# Patient Record
Sex: Female | Born: 1970 | Race: Black or African American | Hispanic: No | Marital: Single | Smoking: Current every day smoker
Health system: Southern US, Community
[De-identification: ages and names within clinical notes are randomized; demographics above are authoritative.]

## PROBLEM LIST (undated history)

## (undated) DIAGNOSIS — E538 Deficiency of other specified B group vitamins: Secondary | ICD-10-CM

## (undated) DIAGNOSIS — F419 Anxiety disorder, unspecified: Secondary | ICD-10-CM

## (undated) DIAGNOSIS — T7840XA Allergy, unspecified, initial encounter: Secondary | ICD-10-CM

## (undated) DIAGNOSIS — D509 Iron deficiency anemia, unspecified: Secondary | ICD-10-CM

## (undated) HISTORY — DX: Allergy, unspecified, initial encounter: T78.40XA

## (undated) HISTORY — DX: Anxiety disorder, unspecified: F41.9

## (undated) HISTORY — PX: ELBOW SURGERY: SHX618

## (undated) MED FILL — Iron Sucrose Inj 20 MG/ML (Fe Equiv): INTRAVENOUS | Qty: 10 | Status: AC

---

## 1993-01-12 HISTORY — PX: TUBAL LIGATION: SHX77

## 2009-09-26 ENCOUNTER — Emergency Department (HOSPITAL_COMMUNITY): Admission: EM | Admit: 2009-09-26 | Discharge: 2009-09-26 | Payer: Self-pay | Admitting: Emergency Medicine

## 2009-10-17 ENCOUNTER — Ambulatory Visit (HOSPITAL_COMMUNITY): Admission: RE | Admit: 2009-10-17 | Discharge: 2009-10-18 | Payer: Self-pay | Admitting: Orthopedic Surgery

## 2009-11-20 ENCOUNTER — Encounter: Payer: Self-pay | Admitting: Orthopedic Surgery

## 2009-12-12 ENCOUNTER — Encounter: Payer: Self-pay | Admitting: Orthopedic Surgery

## 2010-01-12 ENCOUNTER — Encounter: Payer: Self-pay | Admitting: Orthopedic Surgery

## 2010-02-12 ENCOUNTER — Encounter: Payer: Self-pay | Admitting: Orthopedic Surgery

## 2010-03-14 ENCOUNTER — Encounter: Payer: Self-pay | Admitting: Orthopedic Surgery

## 2010-09-01 LAB — CBC
HCT: 36 % (ref 36.0–46.0)
MCV: 87.8 fL (ref 78.0–100.0)
Platelets: 239 10*3/uL (ref 150–400)
RBC: 4.1 MIL/uL (ref 3.87–5.11)
WBC: 7.8 10*3/uL (ref 4.0–10.5)

## 2010-09-01 LAB — BASIC METABOLIC PANEL
CO2: 26 mEq/L (ref 19–32)
GFR calc Af Amer: >60 mL/min (ref >60)
GFR calc non Af Amer: >60 mL/min (ref >60)
Glucose, Bld: 90 mg/dL (ref 70–99)

## 2011-05-16 IMAGING — CR DG ELBOW COMPLETE 3+V*R*
4 series · 4 of 4 positions shown · non-contrast
Comparison: None

CLINICAL DATA: Fell.  Injured right arm.

RIGHT ELBOW - COMPLETE 3+ VIEW

[x elbow joint lat right]
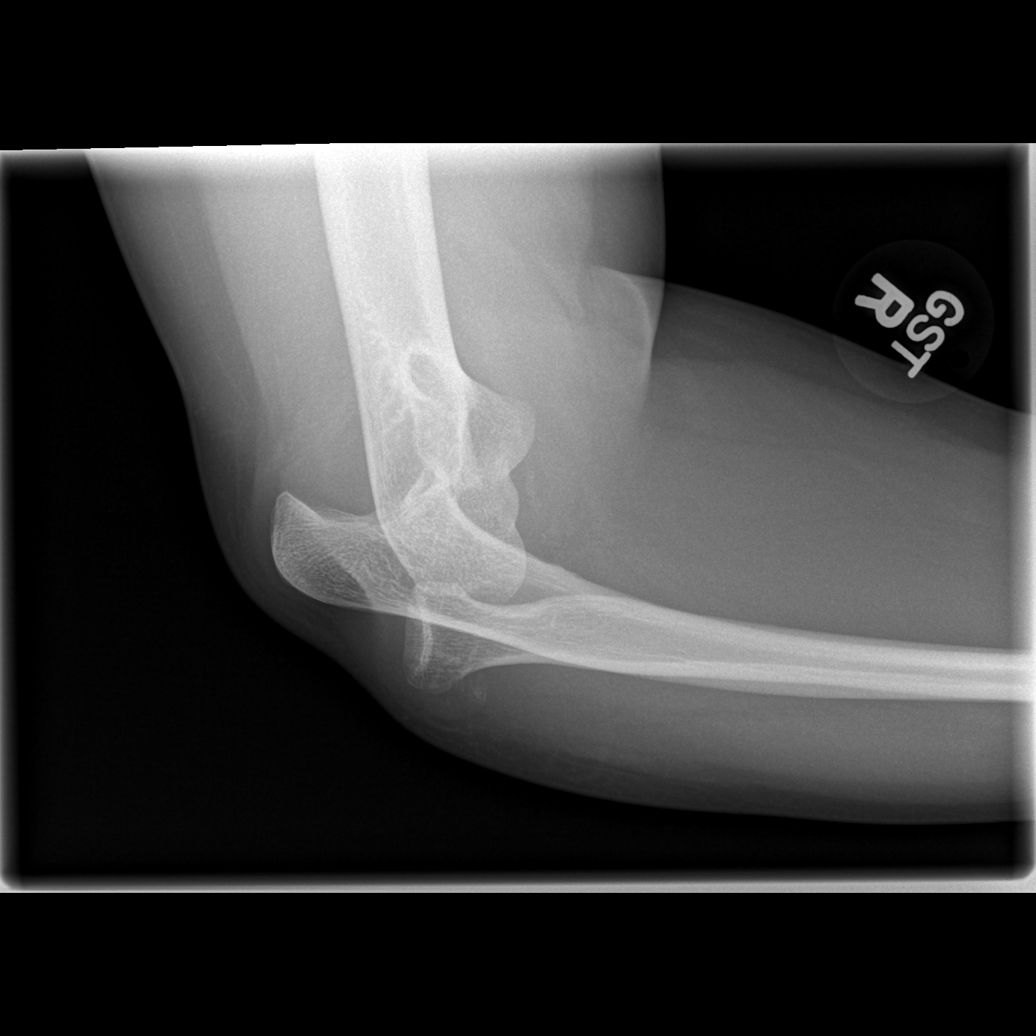

[x elbow joint ap right (1 of 2)]
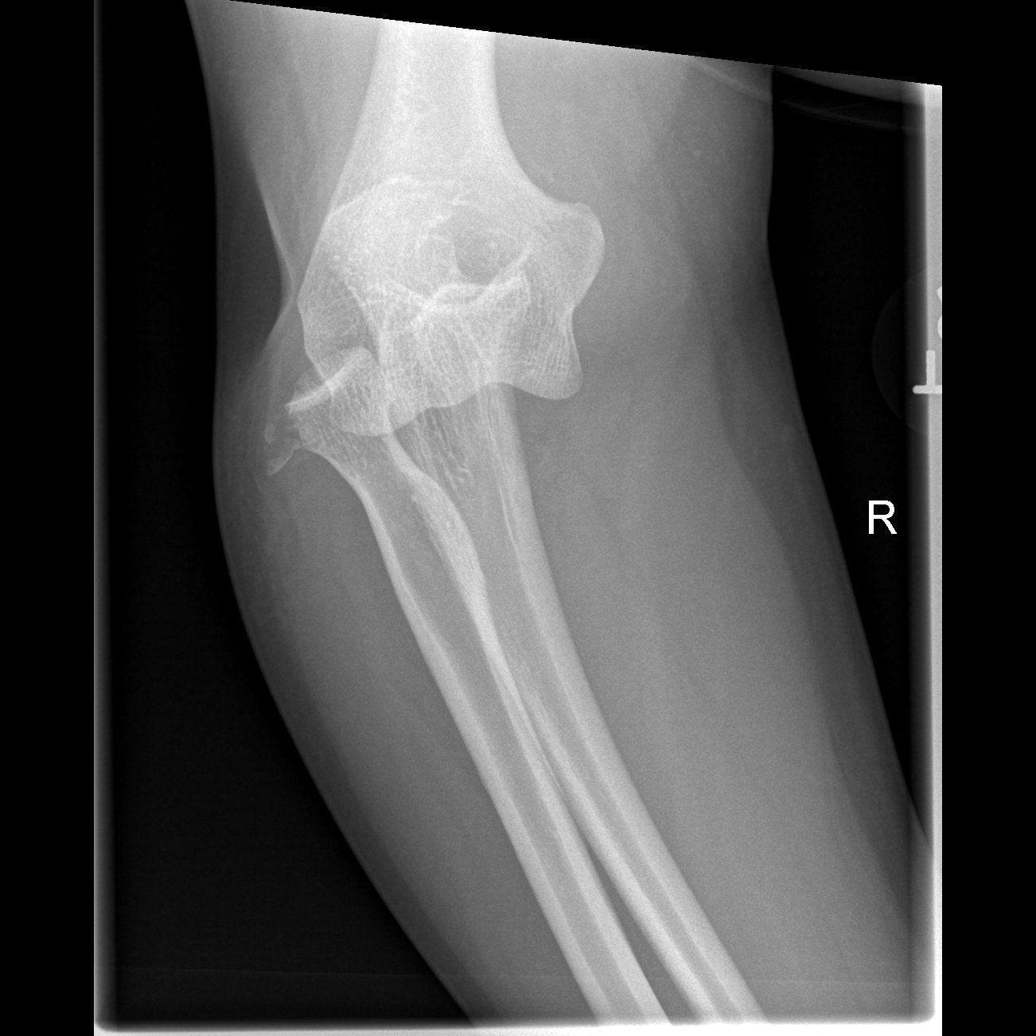

[x elbow joint ap right (2 of 2)]
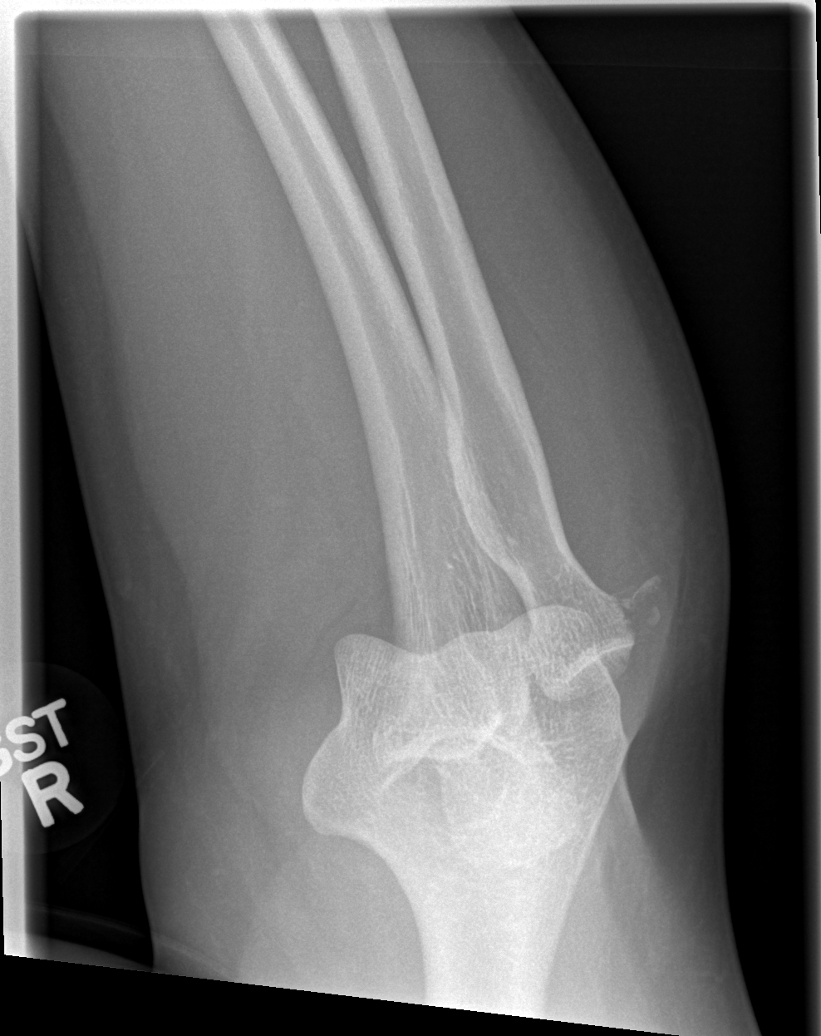

[x elbow joint obl. right]
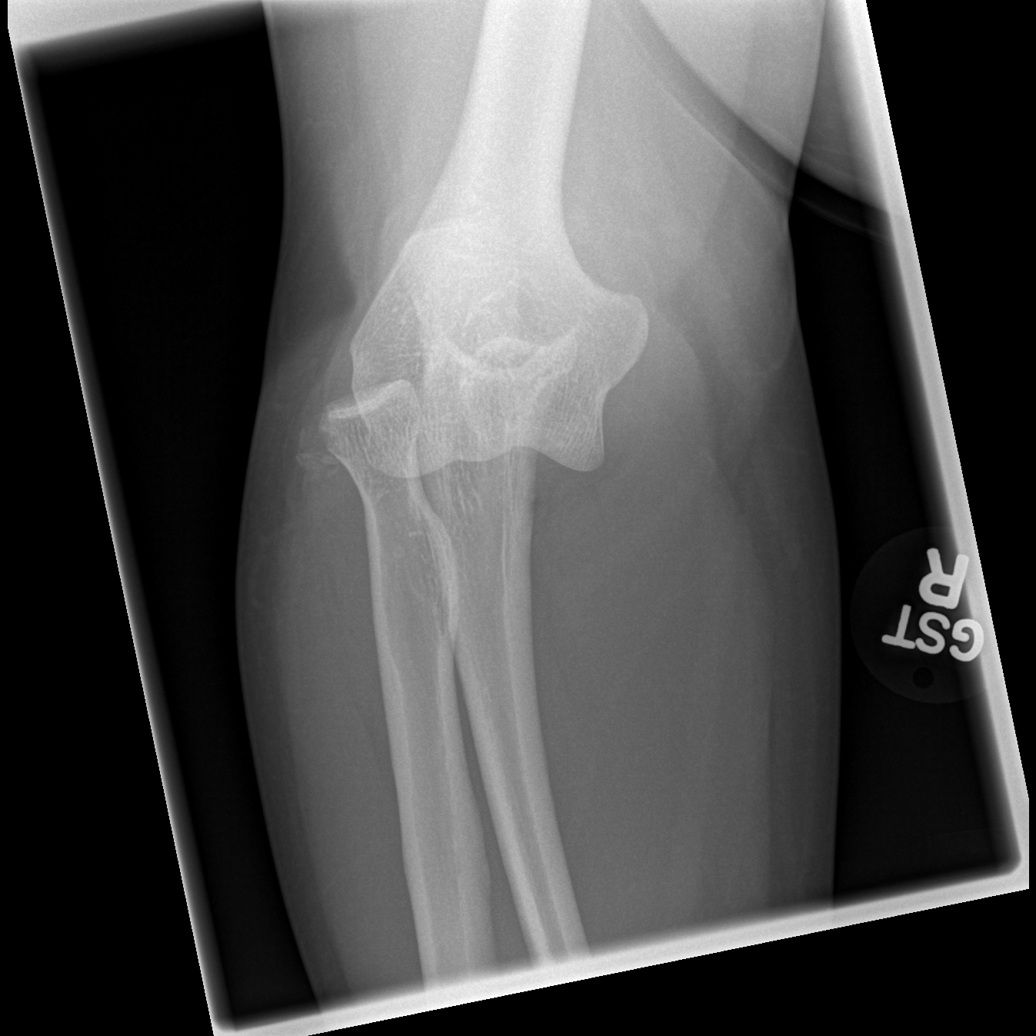

[4 of 4 positions shown; findings below may reference images not displayed]

FINDINGS: There is a posterior dislocation.  There is also a
fracture dislocation involving the radial head.
IMPRESSION: Posterior elbow dislocation with an associated fracture of the
radial head involving less than 20% of the articular surface.

## 2014-04-10 ENCOUNTER — Ambulatory Visit: Payer: Self-pay | Admitting: Obstetrics and Gynecology

## 2014-04-24 ENCOUNTER — Ambulatory Visit: Payer: Self-pay | Admitting: Obstetrics and Gynecology

## 2019-05-11 ENCOUNTER — Ambulatory Visit
Admission: EM | Admit: 2019-05-11 | Discharge: 2019-05-11 | Disposition: A | Discharge status: Home / Self Care | Payer: BLUE CROSS/BLUE SHIELD | Attending: Emergency Medicine | Admitting: Emergency Medicine

## 2019-05-11 DIAGNOSIS — L02412 Cutaneous abscess of left axilla: Secondary | ICD-10-CM | POA: Diagnosis not present

## 2019-05-11 MED ORDER — SULFAMETHOXAZOLE-TRIMETHOPRIM 800-160 MG PO TABS: 1.0000 | ORAL_TABLET | Freq: Two times a day (BID) | ORAL | 0 refills | Status: AC

## 2019-05-11 NOTE — Discharge Instructions (Addendum)
Take the antibiotic as directed.  Keep your wound clean and dry.  Wash it twice a day with soap and water.  Encourage drainage of the pus inside it.    Return here or follow-up with your primary care provider if your abscess is not improving.

## 2019-05-11 NOTE — ED Triage Notes (Signed)
Pt presents with complaints of boil under her left arm that started on Monday. Pt states that it is gradually getting worse. She has tried otc treatment with no relief.

## 2019-05-11 NOTE — ED Provider Notes (Signed)
Hannah Gallagher    CSN: [id number] Arrival date & time: 05/11/19  1156      History   Chief Complaint Chief Complaint  Patient presents with  . Abscess    HPI Hannah Gallagher is a 48 y.o. female.   Patient presents with a large "boil" in her left axilla x 5 days.  She reports the area is red and painful but has no drainage.  She has attempted treatment with warm compresses.  She denies fever, chills, numbness, weakness, tingling, or other symptoms.    The history is provided by the patient.    History reviewed. No pertinent past medical history.  There are no active problems to display for this patient.   Past Surgical History:  Procedure Laterality Date  . ELBOW SURGERY      OB History   No obstetric history on file.      Home Medications    Prior to Admission medications   Medication Sig Start Date End Date Taking? Authorizing Provider  sulfamethoxazole-trimethoprim (BACTRIM DS) 800-160 MG tablet Take 1 tablet by mouth 2 (two) times daily for 10 days. 05/11/19 05/21/19  Mickie Bail, NP    Family History Family History  Problem Relation Age of Onset  . Hypertension Mother   . COPD Father     Social History Social History   Tobacco Use  . Smoking status: Current Every Day Smoker  . Smokeless tobacco: Never Used  Substance Use Topics  . Alcohol use: Yes    Comment: socially  . Drug use: Never     Allergies   Amoxicillin   Review of Systems Review of Systems  Constitutional: Negative for chills and fever.  HENT: Negative for ear pain and sore throat.   Eyes: Negative for pain and visual disturbance.  Respiratory: Negative for cough and shortness of breath.   Cardiovascular: Negative for chest pain and palpitations.  Gastrointestinal: Negative for abdominal pain and vomiting.  Genitourinary: Negative for dysuria and hematuria.  Musculoskeletal: Negative for arthralgias and back pain.  Skin: Positive for wound. Negative for  color change and rash.  Neurological: Negative for seizures and syncope.  All other systems reviewed and are negative.    Physical Exam Triage Vital Signs ED Triage Vitals [05/11/19 1157]  Enc Vitals Group     BP      Pulse      Resp      Temp      Temp src      SpO2      Weight      Height      Head Circumference      Peak Flow      Pain Score 6     Pain Loc      Pain Edu?      Excl. in GC?    No data found.  Updated Vital Signs BP (!) 168/82   Pulse 86   Temp 98.7 F (37.1 C)   Resp 18   LMP 04/23/2019   SpO2 96%   Visual Acuity Right Eye Distance:   Left Eye Distance:   Bilateral Distance:    Right Eye Near:   Left Eye Near:    Bilateral Near:     Physical Exam Vitals signs and nursing note reviewed.  Constitutional:      General: She is not in acute distress.    Appearance: She is well-developed. She is not ill-appearing.  HENT:     Head: Normocephalic and  atraumatic.     Mouth/Throat:     Mouth: Mucous membranes are moist.  Eyes:     Conjunctiva/sclera: Conjunctivae normal.  Neck:     Musculoskeletal: Neck supple.  Cardiovascular:     Rate and Rhythm: Normal rate and regular rhythm.     Heart sounds: No murmur.  Pulmonary:     Effort: Pulmonary effort is normal. No respiratory distress.     Breath sounds: Normal breath sounds.  Abdominal:     Palpations: Abdomen is soft.     Tenderness: There is no abdominal tenderness. There is no guarding or rebound.  Skin:    General: Skin is warm and dry.     Capillary Refill: Capillary refill takes less than 2 seconds.     Comments: 5 cm x 3 cm abscess in left axilla. No drainage. Localized erythema.  Fluctuant in center but firm around edges.    Neurological:     General: No focal deficit present.     Mental Status: She is alert and oriented to person, place, and time.     Sensory: No sensory deficit.     Motor: No weakness.  Psychiatric:        Mood and Affect: Mood normal.        Behavior:  Behavior normal.      UC Treatments / Results  Labs (all labs ordered are listed, but only abnormal results are displayed) Labs Reviewed - No data to display  EKG   Radiology No results found.  Procedures Incision and Drainage  Date/Time: 05/11/2019 12:19 PM Performed by: Mickie Bailate, Clete Kuch H, NP Authorized by: Mickie Bailate, Tailor Westfall H, NP   Consent:    Consent obtained:  Verbal   Consent given by:  Patient Location:    Type:  Abscess   Location:  Upper extremity Pre-procedure details:    Skin preparation:  Antiseptic wash Anesthesia (see MAR for exact dosages):    Anesthesia method:  Local infiltration   Local anesthetic:  Lidocaine 2% w/o epi Procedure details:    Incision types:  Single straight   Scalpel blade:  11   Drainage:  Purulent   Drainage amount:  Moderate   Wound treatment:  Wound left open   Packing materials:  None Post-procedure details:    Patient tolerance of procedure:  Tolerated well, no immediate complications   (including critical care time)  Medications Ordered in UC Medications - No data to display  Initial Impression / Assessment and Plan / UC Course  I have reviewed the triage vital signs and the nursing notes.  Pertinent labs & imaging results that were available during my care of the patient were reviewed by me and considered in my medical decision making (see chart for details).    Abscess of the left axilla.  I&D performed.  Treating with Septra DS.  Wound care instructions and signs of worsening infection discussed with patient.  Instructed her to return here or follow-up with her PCP if her abscess not improving.  Patient agrees to plan of care.     Final Clinical Impressions(s) / UC Diagnoses   Final diagnoses:  Abscess of axilla, left     Discharge Instructions     Take the antibiotic as directed.  Keep your wound clean and dry.  Wash it twice a day with soap and water.  Encourage drainage of the pus inside it.    Return here or  follow-up with your primary care provider if your abscess is not improving.  ED Prescriptions    Medication Sig Dispense Auth. Provider   sulfamethoxazole-trimethoprim (BACTRIM DS) 800-160 MG tablet Take 1 tablet by mouth 2 (two) times daily for 10 days. 20 tablet Sharion Balloon, NP     PDMP not reviewed this encounter.   Sharion Balloon, NP 05/11/19 1225

## 2021-08-12 DIAGNOSIS — Z124 Encounter for screening for malignant neoplasm of cervix: Secondary | ICD-10-CM | POA: Diagnosis not present

## 2021-08-12 DIAGNOSIS — Z01419 Encounter for gynecological examination (general) (routine) without abnormal findings: Secondary | ICD-10-CM | POA: Diagnosis not present

## 2021-08-13 ENCOUNTER — Telehealth: Payer: Self-pay

## 2021-08-13 NOTE — Telephone Encounter (Signed)
Called to schedule and patient stated she is having constipation so scheduled for in person visit 10/08/2021 ?

## 2021-08-14 LAB — HM PAP SMEAR: HM Pap smear: NEGATIVE

## 2021-08-18 DIAGNOSIS — Z1231 Encounter for screening mammogram for malignant neoplasm of breast: Secondary | ICD-10-CM | POA: Diagnosis not present

## 2021-08-18 LAB — HM MAMMOGRAPHY

## 2021-08-31 ENCOUNTER — Ambulatory Visit
Admission: RE | Admit: 2021-08-31 | Discharge: 2021-08-31 | Disposition: A | Discharge status: Home / Self Care | Payer: BC Managed Care – PPO | Attending: Emergency Medicine | Admitting: Emergency Medicine

## 2021-08-31 ENCOUNTER — Other Ambulatory Visit: Payer: Self-pay

## 2021-08-31 ENCOUNTER — Ambulatory Visit
Admission: RE | Admit: 2021-08-31 | Discharge: 2021-08-31 | Disposition: A | Discharge status: Home / Self Care | Payer: BC Managed Care – PPO | Source: Ambulatory Visit | Attending: Emergency Medicine | Admitting: Emergency Medicine

## 2021-08-31 ENCOUNTER — Encounter: Payer: Self-pay | Admitting: Emergency Medicine

## 2021-08-31 ENCOUNTER — Ambulatory Visit
Admission: EM | Admit: 2021-08-31 | Discharge: 2021-08-31 | Disposition: A | Discharge status: Home / Self Care | Payer: BC Managed Care – PPO | Attending: Emergency Medicine | Admitting: Emergency Medicine

## 2021-08-31 DIAGNOSIS — R1084 Generalized abdominal pain: Secondary | ICD-10-CM

## 2021-08-31 DIAGNOSIS — K5900 Constipation, unspecified: Secondary | ICD-10-CM | POA: Diagnosis present

## 2021-08-31 DIAGNOSIS — R030 Elevated blood-pressure reading, without diagnosis of hypertension: Secondary | ICD-10-CM | POA: Diagnosis present

## 2021-08-31 LAB — POCT URINALYSIS DIP (MANUAL ENTRY)
Bilirubin, UA: NEGATIVE
Blood, UA: NEGATIVE
Glucose, UA: NEGATIVE mg/dL
Ketones, POC UA: NEGATIVE mg/dL
Nitrite, UA: NEGATIVE
Protein Ur, POC: NEGATIVE mg/dL
Spec Grav, UA: 1.015 (ref 1.010–1.025)
Urobilinogen, UA: 1 E.U./dL
pH, UA: 6 (ref 5.0–8.0)

## 2021-08-31 LAB — POCT URINE PREGNANCY: Preg Test, Ur: NEGATIVE

## 2021-08-31 NOTE — Discharge Instructions (Addendum)
Go to [facility] for your x-ray.   ?[address]  ?[address] ?[place], [place] [address] ?[phone] ? ?Use a Fleets enema today.  Take MiraLAX and Colace daily as directed.  Follow-up with gastroenterology as scheduled.  ? ?Go to the emergency department if you have acute abdominal pain or other concerning symptoms. ? ?Your blood pressure is elevated today at 177/93; repeat 159/88.  Please have this rechecked by your primary care provider in 1-2 weeks.     ? ?

## 2021-08-31 NOTE — ED Provider Notes (Signed)
?[facility] ? ? ? ?CSN: [id number] ?Arrival date & time: 08/31/21  7939 ? ? ?  ? ?History   ?Chief Complaint ?Chief Complaint  ?Patient presents with  ? Constipation  ? Bloated  ? ? ?HPI ?Hannah Gallagher is a 51 y.o. female.  Patient presents with 1 month history of abdominal bloating, abdominal discomfort, constipation, low back pain.  No fever, nausea, vomiting, diarrhea, blood in stool, dysuria, hematuria, vaginal discharge, pelvic pain, or other symptoms.  Last bowel movement 3 days ago. Treatment attempted with Senakot, stool softener, Dulcolax.   Patient has an appointment with [facility] on 10/08/2021.   ? ?The history is provided by the patient and medical records.  ? ?History reviewed. No pertinent past medical history. ? ?There are no problems to display for this patient. ? ? ?Past Surgical History:  ?Procedure Laterality Date  ? ELBOW SURGERY    ? ? ?OB History   ?No obstetric history on file. ?  ? ? ? ?Home Medications   ? ?Prior to Admission medications   ?Not on File  ? ? ?Family History ?Family History  ?Problem Relation Age of Onset  ? Hypertension Mother   ? COPD Father   ? ? ?Social History ?Social History  ? ?Tobacco Use  ? Smoking status: Every Day  ?  Types: Cigarettes  ? Smokeless tobacco: Never  ?Vaping Use  ? Vaping Use: Never used  ?Substance Use Topics  ? Alcohol use: Yes  ?  Comment: socially  ? Drug use: Never  ? ? ? ?Allergies   ?Amoxicillin ? ? ?Review of Systems ?Review of Systems  ?Constitutional:  Negative for chills and fever.  ?Gastrointestinal:  Positive for abdominal pain and constipation. Negative for blood in stool, diarrhea, nausea and vomiting.  ?Genitourinary:  Negative for dysuria, flank pain, hematuria, pelvic pain and vaginal discharge.  ?Musculoskeletal:  Positive for back pain.  ?All other systems reviewed and are negative. ? ? ?Physical Exam ?Triage Vital Signs ?ED Triage Vitals  ?Enc Vitals Group  ?   BP   ?   Pulse   ?   Resp   ?   Temp    ?   Temp src   ?   SpO2   ?   Weight   ?   Height   ?   Head Circumference   ?   Peak Flow   ?   Pain Score   ?   Pain Loc   ?   Pain Edu?   ?   Excl. in GC?   ? ?No data found. ? ?Updated Vital Signs ?BP (!) 159/88 (BP Location: Right Arm)   Pulse (!) 105   Temp 97.9 ?F (36.6 ?C)   Resp 18   LMP 08/12/2021   SpO2 94%  ? ?Visual Acuity ?Right Eye Distance:   ?Left Eye Distance:   ?Bilateral Distance:   ? ?Right Eye Near:   ?Left Eye Near:    ?Bilateral Near:    ? ?Physical Exam ?Vitals and nursing note reviewed.  ?Constitutional:   ?   General: She is not in acute distress. ?   Appearance: She is well-developed. She is not ill-appearing.  ?HENT:  ?   Mouth/Throat:  ?   Mouth: Mucous membranes are moist.  ?Cardiovascular:  ?   Rate and Rhythm: Normal rate and regular rhythm.  ?   Heart sounds: Normal heart sounds.  ?Pulmonary:  ?   Effort: Pulmonary effort is normal.  No respiratory distress.  ?   Breath sounds: Normal breath sounds.  ?Abdominal:  ?   General: Bowel sounds are normal.  ?   Palpations: Abdomen is soft.  ?   Tenderness: There is no abdominal tenderness. There is no right CVA tenderness, left CVA tenderness, guarding or rebound.  ?Musculoskeletal:  ?   Cervical back: Neck supple.  ?Skin: ?   General: Skin is warm and dry.  ?Neurological:  ?   Mental Status: She is alert.  ?Psychiatric:     ?   Mood and Affect: Mood normal.     ?   Behavior: Behavior normal.  ? ? ? ?UC Treatments / Results  ?Labs ?(all labs ordered are listed, but only abnormal results are displayed) ?Labs Reviewed  ?POCT URINALYSIS DIP (MANUAL ENTRY) - Abnormal; Notable for the following components:  ?    Result Value  ? Clarity, UA cloudy (*)   ? Leukocytes, UA Small (1+) (*)   ? All other components within normal limits  ?URINE CULTURE  ?CBC  ?COMPREHENSIVE METABOLIC PANEL  ?POCT URINE PREGNANCY  ? ? ?EKG ? ? ?Radiology ?DG Abd 1 View ? ?Result Date: 08/31/2021 ?CLINICAL DATA:  Abdominal pain, constipation EXAM: ABDOMEN - 1 VIEW  COMPARISON:  None. FINDINGS: Bowel-gas pattern is nonobstructive. No significantly large amount of retained fecal material appreciated. No suspicious calcifications identified. IMPRESSION: Unremarkable study. Electronically Signed   By: Jannifer Hick M.D.   On: 08/31/2021 10:36   ? ?Procedures ?Procedures (including critical care time) ? ?Medications Ordered in UC ?Medications - No data to display ? ?Initial Impression / Assessment and Plan / UC Course  ?I have reviewed the triage vital signs and the nursing notes. ? ?Pertinent labs & imaging results that were available during my care of the patient were reviewed by me and considered in my medical decision making (see chart for details). ? ? Constipation, Generalized abdominal pain.  Afebrile, VSS.  Abdomen is soft and nontender with good bowel sounds.  Urine has small leukocytes, urine culture pending.  Urine preg negative.  CBC, CMP pending.  KUB negative.  Discussed Fleets enema today and starting Miralax and Colace daily as directed with instructions to decrease use if she develops diarrhea.  ED precautions discussed.   Also discussed with patient that her blood pressure is elevated today and needs to be rechecked by PCP in 1-2 weeks.  Education provided on managing hypertension.   ? ? ?Final Clinical Impressions(s) / UC Diagnoses  ? ?Final diagnoses:  ?Constipation, unspecified constipation type  ?Generalized abdominal pain  ?Elevated blood pressure reading  ? ? ? ?Discharge Instructions   ? ?  ?Go to [facility] for your x-ray.   ?[address]  ?[address] ?[place], [place] [address] ?[phone] ? ?Use a Fleets enema today.  Take MiraLAX and Colace daily as directed.  Follow-up with gastroenterology as scheduled.  ? ?Go to the emergency department if you have acute abdominal pain or other concerning symptoms. ? ?Your blood pressure is elevated today at 177/93; repeat 159/88.  Please have this rechecked by your primary  care provider in 1-2 weeks.     ? ? ? ? ? ?ED Prescriptions   ?None ?  ? ?PDMP not reviewed this encounter. ?  ?Mickie Bail, NP ?08/31/21 1043 ? ?

## 2021-08-31 NOTE — ED Triage Notes (Signed)
Pt c/o constipation, bloating and back pain x 1 month. Pt has tried laxatives and she is able to pass stool, but its not a lot.  ?

## 2021-09-01 ENCOUNTER — Telehealth: Payer: Self-pay | Admitting: Emergency Medicine

## 2021-09-01 LAB — COMPREHENSIVE METABOLIC PANEL
ALT: 14 IU/L (ref 0–32)
AST: 26 IU/L (ref 0–40)
Albumin/Globulin Ratio: 1.3 (ref 1.2–2.2)
Albumin: 4.3 g/dL (ref 3.8–4.8)
Alkaline Phosphatase: 108 IU/L (ref 44–121)
BUN/Creatinine Ratio: 11 (ref 9–23)
BUN: 7 mg/dL (ref 6–24)
Bilirubin Total: 0.2 mg/dL (ref 0.0–1.2)
CO2: 24 mmol/L (ref 20–29)
Calcium: 9.4 mg/dL (ref 8.7–10.2)
Chloride: 101 mmol/L (ref 96–106)
Creatinine, Ser: 0.64 mg/dL (ref 0.57–1.00)
Globulin, Total: 3.2 g/dL (ref 1.5–4.5)
Glucose: 95 mg/dL (ref 70–99)
Potassium: 4.1 mmol/L (ref 3.5–5.2)
Sodium: 142 mmol/L (ref 134–144)
Total Protein: 7.5 g/dL (ref 6.0–8.5)
eGFR: 108 mL/min/{1.73_m2} (ref >59)

## 2021-09-01 LAB — CBC
Hematocrit: 31.5 % — ABNORMAL LOW (ref 34.0–46.6)
Hemoglobin: 10 g/dL — ABNORMAL LOW (ref 11.1–15.9)
MCH: 24.4 pg — ABNORMAL LOW (ref 26.6–33.0)
MCHC: 31.7 g/dL (ref 31.5–35.7)
MCV: 77 fL — ABNORMAL LOW (ref 79–97)
Platelets: 355 10*3/uL (ref 150–450)
RBC: 4.09 x10E6/uL (ref 3.77–5.28)
RDW: 15.8 % — ABNORMAL HIGH (ref 11.7–15.4)
WBC: 8.1 10*3/uL (ref 3.4–10.8)

## 2021-09-01 LAB — URINE CULTURE

## 2021-09-01 NOTE — Telephone Encounter (Signed)
Telephone call to patient.  Discussed CBC and CMP results.  Instructed her to follow up with her PCP for evaluation of her abnormal CBC results.  Also discussed that she should call GI to see if she can be seen sooner; she has an appointment on 10/08/2021.  Reviewed ED precautions if she develops worsening symptoms or new concerning symptoms.  She agrees to this plan of care.   ?

## 2021-09-08 DIAGNOSIS — K5901 Slow transit constipation: Secondary | ICD-10-CM | POA: Diagnosis not present

## 2021-09-08 DIAGNOSIS — N816 Rectocele: Secondary | ICD-10-CM | POA: Diagnosis not present

## 2021-09-11 ENCOUNTER — Ambulatory Visit: Payer: BC Managed Care – PPO | Admitting: Family Medicine

## 2021-10-08 ENCOUNTER — Other Ambulatory Visit: Payer: Self-pay

## 2021-10-08 ENCOUNTER — Ambulatory Visit (INDEPENDENT_AMBULATORY_CARE_PROVIDER_SITE_OTHER): Payer: BC Managed Care – PPO | Admitting: Gastroenterology

## 2021-10-08 ENCOUNTER — Encounter: Payer: Self-pay | Admitting: Gastroenterology

## 2021-10-08 VITALS — BP 148/81 | HR 101 | Temp 97.7°F | Ht 64.0 in | Wt 176.2 lb

## 2021-10-08 DIAGNOSIS — D509 Iron deficiency anemia, unspecified: Secondary | ICD-10-CM | POA: Diagnosis not present

## 2021-10-08 DIAGNOSIS — R194 Change in bowel habit: Secondary | ICD-10-CM

## 2021-10-08 DIAGNOSIS — Z1211 Encounter for screening for malignant neoplasm of colon: Secondary | ICD-10-CM

## 2021-10-08 MED ORDER — NA SULFATE-K SULFATE-MG SULF 17.5-3.13-1.6 GM/177ML PO SOLN: 354.0000 mL | Freq: Once | ORAL | 0 refills | Status: AC

## 2021-10-08 NOTE — Progress Notes (Signed)
?  ?Arlyss Repress, MD ?[address]  ?[address]  ?[place], [place] [address]  ?Main: [phone]  ?Fax: [phone] ? ? ? ?Gastroenterology Consultation ? ?Referring Provider:     Cassell Clement, MD ?Primary Care Physician:  Patient, No Pcp Per (Inactive) ?Primary Gastroenterologist:  Dr. Arlyss Repress ?Reason for Consultation: Microcytic anemia, new onset of constipation ?      ? HPI:   ?Hannah Gallagher is a 51 y.o. female referred by Dr. Patient, No Pcp Per (Inactive)  for consultation & management of new onset of constipation which has started since she had oral surgery in February.  After the surgery, she was not able to eat regular foods, was managing on soft diet only.  She developed severe constipation and was taking liquid Dulcolax.  She developed severe low back pain that prompted her to go to ER on 08/31/2021.  Labs revealed microcytic anemia, hemoglobin 10, MCV 77, CMP normal, UA and urine culture were negative.  Abdominal x-ray did not reveal any significant fecal material.  Patient is therefore referred to GI for further evaluation.  Patient reports that since then she is trying to eat more fiber, cut back on all the sodas.  She does admit to drinking sweet tea and ginger ale along with water.  She does not smoke or drink alcohol.  She works from home and she has lost some weight.  She does reports feeling bloated and feels something down in the rectum area and does not feel right.  She denies any rectal bleeding, however when she has a bowel movement, initially it is formed then runny ? ?NSAIDs: None ? ?Antiplts/Anticoagulants/Anti thrombotics: None ? ?GI Procedures: None ?She denies any family history of GI malignancy ? ?History reviewed. No pertinent past medical history. ? ?Past Surgical History:  ?Procedure Laterality Date  ? ELBOW SURGERY    ? ? ?Current Outpatient Medications:  ?  Na Sulfate-K Sulfate-Mg Sulf 17.5-3.13-1.6 GM/177ML SOLN, Take 354 mLs by mouth once for 1  dose., Disp: 708 mL, Rfl: 0 ? ?Family History  ?Problem Relation Age of Onset  ? Hypertension Mother   ? COPD Father   ?  ? ?Social History  ? ?Tobacco Use  ? Smoking status: Every Day  ?  Types: Cigarettes  ? Smokeless tobacco: Never  ?Vaping Use  ? Vaping Use: Never used  ?Substance Use Topics  ? Alcohol use: Yes  ?  Comment: socially  ? Drug use: Never  ? ? ?Allergies as of 10/08/2021 - Review Complete 10/08/2021  ?Allergen Reaction Noted  ? Amoxicillin  05/11/2019  ? ? ?Review of Systems:    ?All systems reviewed and negative except where noted in HPI. ? ? Physical Exam:  ?BP (!) 148/81 (BP Location: Left Arm, Patient Position: Sitting, Cuff Size: Normal)   Pulse (!) 101   Temp 97.7 ?F (36.5 ?C) (Oral)   Ht 5\' 4"  (1.626 m)   Wt 176 lb 4 oz (79.9 kg)   BMI 30.25 kg/m?  ?No LMP recorded. ? ?General:   Alert,  Well-developed, well-nourished, pleasant and cooperative in NAD ?Head:  Normocephalic and atraumatic. ?Eyes:  Sclera clear, no icterus.   Conjunctiva pink. ?Ears:  Normal auditory acuity. ?Nose:  No deformity, discharge, or lesions. ?Mouth:  No deformity or lesions,oropharynx pink & moist. ?Neck:  Supple; no masses or thyromegaly. ?Lungs:  Respirations even and unlabored.  Clear throughout to auscultation.   No wheezes, crackles, or rhonchi. No acute distress. ?Heart:  Regular  rate and rhythm; no murmurs, clicks, rubs, or gallops. ?Abdomen:  Normal bowel sounds. Soft, non-tender and non-distended without masses, hepatosplenomegaly or hernias noted.  No guarding or rebound tenderness.   ?Rectal: Not performed ?Msk:  Symmetrical without gross deformities. Good, equal movement & strength bilaterally. ?Pulses:  Normal pulses noted. ?Extremities:  No clubbing or edema.  No cyanosis. ?Neurologic:  Alert and oriented x3;  grossly normal neurologically. ?Skin:  Intact without significant lesions or rashes. No jaundice. ?Psych:  Alert and cooperative. Normal mood and affect. ? ?Imaging  Studies: ?Reviewed ? ?Assessment and Plan:  ? ?Rabecca Birge is a 51 y.o. pleasant African-American female with new onset of constipation that started after her oral surgery, microcytic anemia ? ?Check CBC, iron panel, B12 and folate levels ?Check TSH ?Recommend diagnostic colonoscopy with 2-day prep.  If patient has confirmed iron deficiency anemia, discussed about upper endoscopy along with colonoscopy and she is agreeable ?Discussed with patient regarding high-fiber diet and fiber supplements ?Patient feels like she needs to be cleaned out, advised MiraLAX prep ? ? ?Follow up based on the above work-up ? ? ?Arlyss Repress, MD ? ?

## 2021-10-08 NOTE — Patient Instructions (Signed)
Mix 32 ounces of Gatorade with 238 grams of miralax. Drink 8oz every 20 minutes till solution is gone.  ? ?High-Fiber Eating Plan ?Fiber, also called dietary fiber, is a type of carbohydrate. It is found foods such as fruits, vegetables, whole grains, and beans. A high-fiber diet can have many health benefits. Your health care provider may recommend a high-fiber diet to help: ?Prevent constipation. Fiber can make your bowel movements more regular. ?Lower your cholesterol. ?Relieve the following conditions: ?Inflammation of veins in the anus (hemorrhoids). ?Inflammation of specific areas of the digestive tract (uncomplicated diverticulosis). ?A problem of the large intestine, also called the colon, that sometimes causes pain and diarrhea (irritable bowel syndrome, or IBS). ?Prevent overeating as part of a weight-loss plan. ?Prevent heart disease, type 2 diabetes, and certain cancers. ?What are tips for following this plan? ?Reading food labels ? ?Check the nutrition facts label on food products for the amount of dietary fiber. Choose foods that have 5 grams of fiber or more per serving. ?The goals for recommended daily fiber intake include: ?Men (age 50 or younger): 34-38 g. ?Men (over age 50): 28-34 g. ?Women (age 50 or younger): 25-28 g. ?Women (over age 50): 22-25 g. ?Your daily fiber goal is _____________ g. ?Shopping ?Choose whole fruits and vegetables instead of processed forms, such as apple juice or applesauce. ?Choose a wide variety of high-fiber foods such as avocados, lentils, oats, and kidney beans. ?Read the nutrition facts label of the foods you choose. Be aware of foods with added fiber. These foods often have high sugar and sodium amounts per serving. ?Cooking ?Use whole-grain flour for baking and cooking. ?Cook with brown rice instead of white rice. ?Meal planning ?Start the day with a breakfast that is high in fiber, such as a cereal that contains 5 g of fiber or more per serving. ?Eat breads and  cereals that are made with whole-grain flour instead of refined flour or white flour. ?Eat brown rice, bulgur wheat, or millet instead of white rice. ?Use beans in place of meat in soups, salads, and pasta dishes. ?Be sure that half of the grains you eat each day are whole grains. ?General information ?You can get the recommended daily intake of dietary fiber by: ?Eating a variety of fruits, vegetables, grains, nuts, and beans. ?Taking a fiber supplement if you are not able to take in enough fiber in your diet. It is better to get fiber through food than from a supplement. ?Gradually increase how much fiber you consume. If you increase your intake of dietary fiber too quickly, you may have bloating, cramping, or gas. ?Drink plenty of water to help you digest fiber. ?Choose high-fiber snacks, such as berries, raw vegetables, nuts, and popcorn. ?What foods should I eat? ?Fruits ?Berries. Pears. Apples. Oranges. Avocado. Prunes and raisins. Dried figs. ?Vegetables ?Sweet potatoes. Spinach. Kale. Artichokes. Cabbage. Broccoli. Cauliflower. Green peas. Carrots. Squash. ?Grains ?Whole-grain breads. Multigrain cereal. Oats and oatmeal. Brown rice. Barley. Bulgur wheat. Millet. Quinoa. Bran muffins. Popcorn. Rye wafer crackers. ?Meats and other proteins ?Navy beans, kidney beans, and pinto beans. Soybeans. Split peas. Lentils. Nuts and seeds. ?Dairy ?Fiber-fortified yogurt. ?Beverages ?Fiber-fortified soy milk. Fiber-fortified orange juice. ?Other foods ?Fiber bars. ?The items listed above may not be a complete list of recommended foods and beverages. Contact a dietitian for more information. ?What foods should I avoid? ?Fruits ?Fruit juice. Cooked, strained fruit. ?Vegetables ?Fried potatoes. Canned vegetables. Well-cooked vegetables. ?Grains ?White bread. Pasta made with refined flour. White rice. ?Meats   and other proteins ?Fatty cuts of meat. Fried chicken or fried fish. ?Dairy ?Milk. Yogurt. Cream cheese. Sour  cream. ?Fats and oils ?Butters. ?Beverages ?Soft drinks. ?Other foods ?Cakes and pastries. ?The items listed above may not be a complete list of foods and beverages to avoid. Talk with your dietitian about what choices are best for you. ?Summary ?Fiber is a type of carbohydrate. It is found in foods such as fruits, vegetables, whole grains, and beans. ?A high-fiber diet has many benefits. It can help to prevent constipation, lower blood cholesterol, aid weight loss, and reduce your risk of heart disease, diabetes, and certain cancers. ?Increase your intake of fiber gradually. Increasing fiber too quickly may cause cramping, bloating, and gas. Drink plenty of water while you increase the amount of fiber you consume. ?The best sources of fiber include whole fruits and vegetables, whole grains, nuts, seeds, and beans. ?This information is not intended to replace advice given to you by your health care provider. Make sure you discuss any questions you have with your health care provider. ?Document Revised: 10/04/2019 Document Reviewed: 10/04/2019 ?Elsevier Patient Education ? 2023 [employer]vier Inc[employer] ? ?

## 2021-10-09 LAB — CBC
Hematocrit: 31 % — ABNORMAL LOW (ref 34.0–46.6)
Hemoglobin: 9.4 g/dL — ABNORMAL LOW (ref 11.1–15.9)
MCH: 23.8 pg — ABNORMAL LOW (ref 26.6–33.0)
MCHC: 30.3 g/dL — ABNORMAL LOW (ref 31.5–35.7)
MCV: 79 fL (ref 79–97)
Platelets: 346 10*3/uL (ref 150–450)
RBC: 3.95 x10E6/uL (ref 3.77–5.28)
RDW: 16.5 % — ABNORMAL HIGH (ref 11.7–15.4)
WBC: 9 10*3/uL (ref 3.4–10.8)

## 2021-10-09 LAB — IRON,TIBC AND FERRITIN PANEL
Ferritin: 11 ng/mL — ABNORMAL LOW (ref 15–150)
Iron Saturation: 7 % — CL (ref 15–55)
Iron: 24 ug/dL — ABNORMAL LOW (ref 27–159)
Total Iron Binding Capacity: 368 ug/dL (ref 250–450)
UIBC: 344 ug/dL (ref 131–425)

## 2021-10-09 LAB — B12 AND FOLATE PANEL
Folate: <2 ng/mL — ABNORMAL LOW (ref >3.0)
Vitamin B-12: 237 pg/mL (ref 232–1245)

## 2021-10-09 LAB — TSH: TSH: 1.07 u[IU]/mL (ref 0.450–4.500)

## 2021-10-12 ENCOUNTER — Telehealth: Payer: Self-pay

## 2021-10-12 MED ORDER — VITAMIN B-12 1000 MCG PO TABS: 1000.0000 ug | ORAL_TABLET | Freq: Every day | ORAL | 3 refills | Status: AC

## 2021-10-12 MED ORDER — FOLIC ACID 1 MG PO TABS: ORAL_TABLET | ORAL | 0 refills | Status: DC

## 2021-10-12 MED ORDER — FUSION PLUS PO CAPS: 1.0000 | ORAL_CAPSULE | Freq: Every day | ORAL | 5 refills | Status: DC

## 2021-10-12 NOTE — Telephone Encounter (Signed)
Patient verbalized understanding of results. Sent medications to the pharmacy.added EGD to procedures. Made 3 month follow up  ?

## 2021-10-12 NOTE — Telephone Encounter (Signed)
-----   Message from Lin Landsman, MD sent at 10/09/2021 11:21 AM EDT ----- ?[name] ? ?Please inform patient that she has severe iron deficiency as well as folate deficiency.  Her B12 levels are also low normal.  Recommend her to start taking fusion 1 pill daily as well as over-the-counter B12 supplements 1000 mcg daily.  She will need prescription for folic acid 2 mg for 5 days followed by once a day for 1 month ? ?She will also need upper endoscopy because of severe iron deficiency anemia ? ?RV ?

## 2021-10-12 NOTE — Telephone Encounter (Signed)
-----   Message from Toney Reil, MD sent at 10/09/2021 11:22 AM EDT ----- ?Please make a follow-up to see me in 3 months, okay to overbook ? ?RV ?

## 2021-10-27 ENCOUNTER — Ambulatory Visit
Admission: RE | Admit: 2021-10-27 | Discharge: 2021-10-27 | Disposition: A | Discharge status: Home / Self Care | Payer: BC Managed Care – PPO | Attending: Gastroenterology | Admitting: Gastroenterology

## 2021-10-27 ENCOUNTER — Encounter
Admission: RE | Disposition: A | Discharge status: Home / Self Care | Payer: Self-pay | Source: Home / Self Care | Attending: Gastroenterology

## 2021-10-27 ENCOUNTER — Encounter: Payer: Self-pay | Admitting: Gastroenterology

## 2021-10-27 ENCOUNTER — Ambulatory Visit: Payer: BC Managed Care – PPO | Admitting: Anesthesiology

## 2021-10-27 ENCOUNTER — Other Ambulatory Visit: Payer: Self-pay

## 2021-10-27 DIAGNOSIS — K644 Residual hemorrhoidal skin tags: Secondary | ICD-10-CM | POA: Insufficient documentation

## 2021-10-27 DIAGNOSIS — D500 Iron deficiency anemia secondary to blood loss (chronic): Secondary | ICD-10-CM | POA: Diagnosis not present

## 2021-10-27 DIAGNOSIS — K259 Gastric ulcer, unspecified as acute or chronic, without hemorrhage or perforation: Secondary | ICD-10-CM | POA: Diagnosis not present

## 2021-10-27 DIAGNOSIS — K2960 Other gastritis without bleeding: Secondary | ICD-10-CM | POA: Diagnosis not present

## 2021-10-27 DIAGNOSIS — Z1211 Encounter for screening for malignant neoplasm of colon: Secondary | ICD-10-CM | POA: Diagnosis not present

## 2021-10-27 DIAGNOSIS — K3189 Other diseases of stomach and duodenum: Secondary | ICD-10-CM | POA: Diagnosis not present

## 2021-10-27 DIAGNOSIS — D759 Disease of blood and blood-forming organs, unspecified: Secondary | ICD-10-CM | POA: Diagnosis not present

## 2021-10-27 DIAGNOSIS — D126 Benign neoplasm of colon, unspecified: Secondary | ICD-10-CM | POA: Diagnosis not present

## 2021-10-27 DIAGNOSIS — D509 Iron deficiency anemia, unspecified: Secondary | ICD-10-CM | POA: Insufficient documentation

## 2021-10-27 DIAGNOSIS — G709 Myoneural disorder, unspecified: Secondary | ICD-10-CM | POA: Diagnosis not present

## 2021-10-27 DIAGNOSIS — K635 Polyp of colon: Secondary | ICD-10-CM | POA: Insufficient documentation

## 2021-10-27 DIAGNOSIS — F17210 Nicotine dependence, cigarettes, uncomplicated: Secondary | ICD-10-CM | POA: Diagnosis not present

## 2021-10-27 HISTORY — DX: Iron deficiency anemia, unspecified: D50.9

## 2021-10-27 HISTORY — PX: COLONOSCOPY WITH PROPOFOL: SHX5780

## 2021-10-27 HISTORY — DX: Deficiency of other specified B group vitamins: E53.8

## 2021-10-27 HISTORY — PX: ESOPHAGOGASTRODUODENOSCOPY: SHX5428

## 2021-10-27 LAB — POCT PREGNANCY, URINE: Preg Test, Ur: NEGATIVE

## 2021-10-27 SURGERY — COLONOSCOPY WITH PROPOFOL
Anesthesia: General

## 2021-10-27 MED ORDER — LIDOCAINE HCL (CARDIAC) PF 100 MG/5ML IV SOSY
PREFILLED_SYRINGE | INTRAVENOUS | Status: DC | PRN
  Administered 2021-10-27: 100 mg via INTRAVENOUS

## 2021-10-27 MED ORDER — PROPOFOL 10 MG/ML IV BOLUS
INTRAVENOUS | Status: DC | PRN
  Administered 2021-10-27: 100 mg via INTRAVENOUS

## 2021-10-27 MED ORDER — PROPOFOL 500 MG/50ML IV EMUL
INTRAVENOUS | Status: DC | PRN
  Administered 2021-10-27: 150 ug/kg/min via INTRAVENOUS

## 2021-10-27 MED ORDER — SODIUM CHLORIDE 0.9 % IV SOLN: INTRAVENOUS | Status: DC | PRN

## 2021-10-27 MED ORDER — GLYCOPYRROLATE 0.2 MG/ML IJ SOLN
INTRAMUSCULAR | Status: DC | PRN
  Administered 2021-10-27: .2 mg via INTRAVENOUS

## 2021-10-27 NOTE — H&P (Signed)
?  Cephas Darby, MD ?[address]  ?[address]  ?[place], [place] [address]  ?Main: [phone]  ?Fax: [phone] ?Pager: [phone] ? ?Primary Care Physician:  Patient, No Pcp Per (Inactive) ?Primary Gastroenterologist:  Dr. Cephas Darby ? ?Pre-Procedure History & Physical: ?HPI:  Hannah Gallagher is a 51 y.o. female is here for an endoscopy and colonoscopy. ?  ?Past Medical History:  ?Diagnosis Date  ? IDA (iron deficiency anemia)   ? ? ?Past Surgical History:  ?Procedure Laterality Date  ? ELBOW SURGERY    ? ? ?Prior to Admission medications   ?Medication Sig Start Date End Date Taking? Authorizing Provider  ?folic acid (FOLVITE) 1 MG tablet Take 2 tablets (2 mg total) by mouth daily for 5 days, THEN 1 tablet (1 mg total) daily. 10/12/21 11/16/21  Lin Landsman, MD  ?Iron-FA-B Cmp-C-Biot-Probiotic (FUSION PLUS) CAPS Take 1 capsule by mouth daily. 10/12/21   Lin Landsman, MD  ?vitamin B-12 (CYANOCOBALAMIN) 1000 MCG tablet Take 1 tablet (1,000 mcg total) by mouth daily. 10/12/21   Lin Landsman, MD  ? ? ?Allergies as of 10/08/2021 - Review Complete 10/08/2021  ?Allergen Reaction Noted  ? Amoxicillin  05/11/2019  ? ? ?Family History  ?Problem Relation Age of Onset  ? Hypertension Mother   ? COPD Father   ? ? ?Social History  ? ?Socioeconomic History  ? Marital status: Single  ?  Spouse name: Not on file  ? Number of children: Not on file  ? Years of education: Not on file  ? Highest education level: Not on file  ?Occupational History  ? Not on file  ?Tobacco Use  ? Smoking status: Every Day  ?  Types: Cigarettes  ? Smokeless tobacco: Never  ?Vaping Use  ? Vaping Use: Never used  ?Substance and Sexual Activity  ? Alcohol use: Yes  ?  Comment: socially  ? Drug use: Yes  ?  Types: Marijuana  ? Sexual activity: Not on file  ?Other Topics Concern  ? Not on file  ?Social History Narrative  ? Not on file  ? ?Social Determinants of Health  ? ?Financial Resource Strain: Not on file  ?Food  Insecurity: Not on file  ?Transportation Needs: Not on file  ?Physical Activity: Not on file  ?Stress: Not on file  ?Social Connections: Not on file  ?Intimate Partner Violence: Not on file  ? ? ?Review of Systems: ?See HPI, otherwise negative ROS ? ?Physical Exam: ?BP (!) 131/94   Pulse 67   Temp (!) 97.3 ?F (36.3 ?C) (Temporal)   Resp 16   Ht 5\' 4"  (1.626 m)   Wt 79.8 kg   SpO2 100%   BMI 30.21 kg/m?  ?General:   Alert,  pleasant and cooperative in NAD ?Head:  Normocephalic and atraumatic. ?Neck:  Supple; no masses or thyromegaly. ?Lungs:  Clear throughout to auscultation.    ?Heart:  Regular rate and rhythm. ?Abdomen:  Soft, nontender and nondistended. Normal bowel sounds, without guarding, and without rebound.   ?Neurologic:  Alert and  oriented x4;  grossly normal neurologically. ? ?Impression/Plan: ?Hannah Gallagher is here for an endoscopy and colonoscopy to be performed for IDA ? ?Risks, benefits, limitations, and alternatives regarding  endoscopy and colonoscopy have been reviewed with the patient.  Questions have been answered.  All parties agreeable. ? ? ?Sherri Sear, MD  10/27/2021, 9:25 AM ?

## 2021-10-27 NOTE — Anesthesia Preprocedure Evaluation (Addendum)
Anesthesia Evaluation  ?Patient identified by MRN, date of birth, ID band ?Patient awake ? ? ? ?Reviewed: ?Allergy & Precautions, NPO status , Patient's Chart, lab work & pertinent test results ? ?Airway ?Mallampati: III ? ?TM Distance: >3 FB ?Neck ROM: full ? ? ? Dental ? ?(+) Edentulous Upper, Edentulous Lower ?  ?Pulmonary ?Current SmokerPatient did not abstain from smoking.,  ?  ?Pulmonary exam normal ? ? ? ? ? ? ? Cardiovascular ?negative cardio ROS ?Normal cardiovascular exam ? ? ?  ?Neuro/Psych ?neuropathy ? Neuromuscular disease negative psych ROS  ? GI/Hepatic ?negative GI ROS, Neg liver ROS,   ?Endo/Other  ?negative endocrine ROS ? Renal/GU ?negative Renal ROS  ?negative genitourinary ?  ?Musculoskeletal ? ? Abdominal ?Normal abdominal exam  (+)   ?Peds ? Hematology ? ?(+) Blood dyscrasia, anemia ,   ?Anesthesia Other Findings ?Past Surgical History: ?No date: ELBOW SURGERY ? ? ? ? Reproductive/Obstetrics ?negative OB ROS ? ?  ? ? ? ? ? ? ? ? ? ? ? ? ? ?  ?  ? ? ? ? ? ? ? ?Anesthesia Physical ?Anesthesia Plan ? ?ASA: 2 ? ?Anesthesia Plan: General  ? ?Post-op Pain Management: Minimal or no pain anticipated  ? ?Induction: Intravenous ? ?PONV Risk Score and Plan: Propofol infusion and TIVA ? ?Airway Management Planned: Natural Airway ? ?Additional Equipment:  ? ?Intra-op Plan:  ? ?Post-operative Plan:  ? ?Informed Consent: I have reviewed the patients History and Physical, chart, labs and discussed the procedure including the risks, benefits and alternatives for the proposed anesthesia with the patient or authorized representative who has indicated his/her understanding and acceptance.  ? ? ? ?Dental Advisory Given ? ?Plan Discussed with: Anesthesiologist, CRNA and Surgeon ? ?Anesthesia Plan Comments:   ? ? ? ? ? ?Anesthesia Quick Evaluation ? ?

## 2021-10-27 NOTE — Op Note (Signed)
[facility] ?Gastroenterology ?Patient Name: Hannah Gallagher ?Procedure Date: 10/27/2021 9:10 AM ?MRN: [id number] ?Account #: [id number] ?Date of Birth: [birth year] ?Admit Type: Outpatient ?Age: 51 ?Room: [facility] ENDO ROOM 3 ?Gender: Female ?Note Status: Finalized ?Instrument Name: Upper Endoscope [id number] ?Procedure:             Upper GI endoscopy ?Indications:           Unexplained iron deficiency anemia ?Providers:             Toney Reil MD, MD ?Referring MD:          Sallye Lat Md, MD (Referring MD) ?Medicines:             General Anesthesia ?Complications:         No immediate complications. Estimated blood loss: None. ?Procedure:             Pre-Anesthesia Assessment: ?                       - Prior to the procedure, a History and Physical was  ?                       performed, and patient medications and allergies were  ?                       reviewed. The patient is competent. The risks and  ?                       benefits of the procedure and the sedation options and  ?                       risks were discussed with the patient. All questions  ?                       were answered and informed consent was obtained.  ?                       Patient identification and proposed procedure were  ?                       verified by the physician, the nurse, the  ?                       anesthesiologist, the anesthetist and the technician  ?                       in the pre-procedure area in the procedure room in the  ?                       endoscopy suite. Mental Status Examination: alert and  ?                       oriented. Airway Examination: normal oropharyngeal  ?                       airway and neck mobility. Respiratory Examination:  ?                       clear to auscultation. CV Examination: normal.  ?  Prophylactic Antibiotics: The patient does not require  ?                       prophylactic antibiotics. Prior Anticoagulants: The  ?                        patient has taken no previous anticoagulant or  ?                       antiplatelet agents. ASA Grade Assessment: II - A  ?                       patient with mild systemic disease. After reviewing  ?                       the risks and benefits, the patient was deemed in  ?                       satisfactory condition to undergo the procedure. The  ?                       anesthesia plan was to use general anesthesia.  ?                       Immediately prior to administration of medications,  ?                       the patient was re-assessed for adequacy to receive  ?                       sedatives. The heart rate, respiratory rate, oxygen  ?                       saturations, blood pressure, adequacy of pulmonary  ?                       ventilation, and response to care were monitored  ?                       throughout the procedure. The physical status of the  ?                       patient was re-assessed after the procedure. ?                       After obtaining informed consent, the endoscope was  ?                       passed under direct vision. Throughout the procedure,  ?                       the patient's blood pressure, pulse, and oxygen  ?                       saturations were monitored continuously. The  ?                       Endosonoscope was introduced through the mouth, and  ?  advanced to the second part of duodenum. The upper GI  ?                       endoscopy was accomplished without difficulty. The  ?                       patient tolerated the procedure well. ?Findings: ?     The duodenal bulb and examined duodenum were normal. ?     Multiple dispersed medium erosions with no bleeding and no stigmata of  ?     recent bleeding were found in the gastric antrum. Biopsies were taken  ?     with a cold forceps for Helicobacter pylori testing. ?     The gastric body and incisura were normal. Biopsies were taken with a  ?     cold forceps for Helicobacter pylori  testing. ?     The cardia and gastric fundus were normal on retroflexion. ?     Esophagogastric landmarks were identified: the gastroesophageal junction  ?     was found at 39 cm from the incisors. ?     The gastroesophageal junction and examined esophagus were normal. ?Impression:            - Normal duodenal bulb and examined duodenum. ?                       - Erosive gastropathy with no bleeding and no stigmata  ?                       of recent bleeding. Biopsied. ?                       - Normal gastric body and incisura. Biopsied. ?                       - Esophagogastric landmarks identified. ?                       - Normal gastroesophageal junction and esophagus. ?Recommendation:        - Await pathology results. ?                       - Proceed with colonoscopy as scheduled ?                       See colonoscopy report ?Procedure Code(s):     --- Professional --- ?                       [phone], Esophagogastroduodenoscopy, flexible,  ?                       transoral; with biopsy, single or multiple ?Diagnosis Code(s):     --- Professional --- ?                       K31.89, Other diseases of stomach and duodenum ?                       D50.9, Iron deficiency anemia, unspecified ?CPT copyright 2019 American Medical Association. All rights reserved. ?The codes documented in this report are preliminary and upon coder review may  ?  be revised to meet current compliance requirements. ?Dr. Libby Maw ?Toney Reil MD, MD ?10/27/2021 9:46:39 AM ?This report has been signed electronically. ?Number of Addenda: 0 ?Note Initiated On: 10/27/2021 9:10 AM ?Estimated Blood Loss:  Estimated blood loss: none. ?     [facility] ?

## 2021-10-27 NOTE — Anesthesia Postprocedure Evaluation (Signed)
Anesthesia Post Note ? ?Patient: Hannah Gallagher ? ?Procedure(s) Performed: COLONOSCOPY WITH PROPOFOL ?ESOPHAGOGASTRODUODENOSCOPY (EGD) ? ?Patient location during evaluation: Endoscopy ?Anesthesia Type: General ?Level of consciousness: awake and alert ?Pain management: pain level controlled ?Vital Signs Assessment: post-procedure vital signs reviewed and stable ?Respiratory status: spontaneous breathing, nonlabored ventilation and respiratory function stable ?Cardiovascular status: blood pressure returned to baseline and stable ?Postop Assessment: no apparent nausea or vomiting ?Anesthetic complications: no ? ? ?No notable events documented. ? ? ?Last Vitals:  ?Vitals:  ? 10/27/21 1014 10/27/21 1024  ?BP: 101/68 120/76  ?Pulse: 75 69  ?Resp: (!) 23 18  ?Temp:    ?SpO2: 98% 100%  ?  ?Last Pain:  ?Vitals:  ? 10/27/21 1024  ?TempSrc:   ?PainSc: 0-No pain  ? ? ?  ?  ?  ?  ?  ?  ? ?[place] Ouch ? ? ? ? ?

## 2021-10-27 NOTE — Op Note (Signed)
[facility] ?Gastroenterology ?Patient Name: Hannah Gallagher ?Procedure Date: 10/27/2021 9:09 AM ?MRN: [id number] ?Account #: [id number] ?Date of Birth: 1970/10/17 ?Admit Type: Outpatient ?Age: 51 ?Room: [facility] ENDO ROOM 3 ?Gender: Female ?Note Status: Finalized ?Instrument Name: Colonoscope [id number] ?Procedure:             Colonoscopy ?Indications:           Unexplained iron deficiency anemia ?Providers:             Toney Reil MD, MD ?Referring MD:          Sallye Lat Md, MD (Referring MD) ?Medicines:             General Anesthesia ?Complications:         No immediate complications. Estimated blood loss: None. ?Procedure:             Pre-Anesthesia Assessment: ?                       - Prior to the procedure, a History and Physical was  ?                       performed, and patient medications and allergies were  ?                       reviewed. The patient is competent. The risks and  ?                       benefits of the procedure and the sedation options and  ?                       risks were discussed with the patient. All questions  ?                       were answered and informed consent was obtained.  ?                       Patient identification and proposed procedure were  ?                       verified by the physician, the nurse, the  ?                       anesthesiologist, the anesthetist and the technician  ?                       in the pre-procedure area in the procedure room in the  ?                       endoscopy suite. Mental Status Examination: alert and  ?                       oriented. Airway Examination: normal oropharyngeal  ?                       airway and neck mobility. Respiratory Examination:  ?                       clear to auscultation. CV Examination: normal.  ?  Prophylactic Antibiotics: The patient does not require  ?                       prophylactic antibiotics. Prior Anticoagulants: The  ?                       patient has  taken no previous anticoagulant or  ?                       antiplatelet agents. ASA Grade Assessment: II - A  ?                       patient with mild systemic disease. After reviewing  ?                       the risks and benefits, the patient was deemed in  ?                       satisfactory condition to undergo the procedure. The  ?                       anesthesia plan was to use general anesthesia.  ?                       Immediately prior to administration of medications,  ?                       the patient was re-assessed for adequacy to receive  ?                       sedatives. The heart rate, respiratory rate, oxygen  ?                       saturations, blood pressure, adequacy of pulmonary  ?                       ventilation, and response to care were monitored  ?                       throughout the procedure. The physical status of the  ?                       patient was re-assessed after the procedure. ?                       After obtaining informed consent, the colonoscope was  ?                       passed under direct vision. Throughout the procedure,  ?                       the patient's blood pressure, pulse, and oxygen  ?                       saturations were monitored continuously. The  ?                       Colonoscope was introduced through the anus and  ?  advanced to the the terminal ileum, with  ?                       identification of the appendiceal orifice and IC  ?                       valve. The colonoscopy was performed without  ?                       difficulty. The patient tolerated the procedure well.  ?                       The quality of the bowel preparation was fair. ?Findings: ?     The perianal and digital rectal examinations were normal. Pertinent  ?     negatives include normal sphincter tone and no palpable rectal lesions. ?     The terminal ileum appeared normal. ?     Three sessile polyps were found in the recto-sigmoid colon.  The polyps  ?     were 3 to 4 mm in size. These polyps were removed with a cold snare.  ?     Resection and retrieval were complete. ?     Non-bleeding external hemorrhoids were found during endoscopy. The  ?     hemorrhoids were medium-sized. ?Impression:            - Preparation of the colon was fair. ?                       - The examined portion of the ileum was normal. ?                       - Three 3 to 4 mm polyps at the recto-sigmoid colon,  ?                       removed with a cold snare. Resected and retrieved. ?                       - Non-bleeding external hemorrhoids. ?Recommendation:        - Repeat colonoscopy in 3 years for surveillance given  ?                       fair prep. ?                       - To visualize the small bowel, perform video capsule  ?                       endoscopy at appointment to be scheduled. ?Procedure Code(s):     --- Professional --- ?                       [phone] [id number], Colonoscopy, flexible; with removal of  ?                       tumor(s), polyp(s), or other lesion(s) by snare  ?                       technique ?Diagnosis Code(s):     --- Professional --- ?  K64.4, Residual hemorrhoidal skin tags ?                       K63.5, Polyp of colon ?                       D50.9, Iron deficiency anemia, unspecified ?CPT copyright 2019 American Medical Association. All rights reserved. ?The codes documented in this report are preliminary and upon coder review may  ?be revised to meet current compliance requirements. ?Dr. Libby Maw ?Jaquavis Felmlee Alger Memos MD, MD ?10/27/2021 10:02:18 AM ?This report has been signed electronically. ?Number of Addenda: 0 ?Note Initiated On: 10/27/2021 9:09 AM ?Scope Withdrawal Time: 0 hours 8 minutes 9 seconds  ?Total Procedure Duration: 0 hours 11 minutes 24 seconds  ?Estimated Blood Loss:  Estimated blood loss: none. ?     [facility] ?

## 2021-10-27 NOTE — Transfer of Care (Signed)
Immediate Anesthesia Transfer of Care Note ? ?Patient: Hannah Gallagher ? ?Procedure(s) Performed: COLONOSCOPY WITH PROPOFOL ?ESOPHAGOGASTRODUODENOSCOPY (EGD) ? ?Patient Location: PACU and Endoscopy Unit ? ?Anesthesia Type:General ? ?Level of Consciousness: drowsy ? ?Airway & Oxygen Therapy: Patient Spontanous Breathing ? ?Post-op Assessment: Report given to RN ? ?Post vital signs: stable ? ?Last Vitals:  ?Vitals Value Taken Time  ?BP    ?Temp    ?Pulse    ?Resp    ?SpO2    ? ? ?Last Pain:  ?Vitals:  ? 10/27/21 0920  ?TempSrc: Temporal  ?PainSc: 0-No pain  ?   ? ?  ? ?Complications: No notable events documented. ?

## 2021-10-28 ENCOUNTER — Encounter: Payer: Self-pay | Admitting: Gastroenterology

## 2021-10-28 LAB — SURGICAL PATHOLOGY

## 2021-10-29 ENCOUNTER — Encounter: Payer: Self-pay | Admitting: Gastroenterology

## 2021-10-29 ENCOUNTER — Other Ambulatory Visit: Payer: BC Managed Care – PPO

## 2021-10-29 ENCOUNTER — Telehealth: Payer: Self-pay

## 2021-10-29 DIAGNOSIS — D509 Iron deficiency anemia, unspecified: Secondary | ICD-10-CM

## 2021-10-29 NOTE — Telephone Encounter (Signed)
-----   Message from Toney Reil, MD sent at 10/28/2021 12:40 PM EDT ----- Pathology results came back normal, Recommend video capsule endoscopy for unexplained IDA  RV

## 2021-10-29 NOTE — Telephone Encounter (Signed)
Patient verbalized understanding of instructions. Sent instructions to [employer] and mailed. Scheduled for 11/17/2021

## 2021-11-03 DIAGNOSIS — S335XXA Sprain of ligaments of lumbar spine, initial encounter: Secondary | ICD-10-CM | POA: Diagnosis not present

## 2021-11-05 DIAGNOSIS — M9905 Segmental and somatic dysfunction of pelvic region: Secondary | ICD-10-CM | POA: Diagnosis not present

## 2021-11-05 DIAGNOSIS — M5432 Sciatica, left side: Secondary | ICD-10-CM | POA: Diagnosis not present

## 2021-11-05 DIAGNOSIS — M5431 Sciatica, right side: Secondary | ICD-10-CM | POA: Diagnosis not present

## 2021-11-05 DIAGNOSIS — M9903 Segmental and somatic dysfunction of lumbar region: Secondary | ICD-10-CM | POA: Diagnosis not present

## 2021-11-06 DIAGNOSIS — M5431 Sciatica, right side: Secondary | ICD-10-CM | POA: Diagnosis not present

## 2021-11-06 DIAGNOSIS — M9903 Segmental and somatic dysfunction of lumbar region: Secondary | ICD-10-CM | POA: Diagnosis not present

## 2021-11-06 DIAGNOSIS — M9905 Segmental and somatic dysfunction of pelvic region: Secondary | ICD-10-CM | POA: Diagnosis not present

## 2021-11-06 DIAGNOSIS — M5432 Sciatica, left side: Secondary | ICD-10-CM | POA: Diagnosis not present

## 2021-11-08 ENCOUNTER — Other Ambulatory Visit: Payer: Self-pay | Admitting: Gastroenterology

## 2021-11-10 ENCOUNTER — Encounter: Payer: Self-pay | Admitting: Family Medicine

## 2021-11-10 ENCOUNTER — Ambulatory Visit (INDEPENDENT_AMBULATORY_CARE_PROVIDER_SITE_OTHER): Payer: BC Managed Care – PPO | Admitting: Family Medicine

## 2021-11-10 VITALS — BP 120/76 | HR 85 | Temp 98.6°F | Resp 16 | Ht 64.0 in | Wt 175.3 lb

## 2021-11-10 DIAGNOSIS — D500 Iron deficiency anemia secondary to blood loss (chronic): Secondary | ICD-10-CM

## 2021-11-10 DIAGNOSIS — Z1331 Encounter for screening for depression: Secondary | ICD-10-CM | POA: Diagnosis not present

## 2021-11-10 DIAGNOSIS — F17200 Nicotine dependence, unspecified, uncomplicated: Secondary | ICD-10-CM | POA: Insufficient documentation

## 2021-11-10 NOTE — Assessment & Plan Note (Signed)
PHQ results reviewed; denies SI or HI Encouraged to seek resources for medication or counseling Does not wish to seek further assistance regarding depression at this time

## 2021-11-10 NOTE — Assessment & Plan Note (Signed)
Chronic, recent exacerbation since 08/2021 Was seen in ER with prompted visit to GI Plan for capsule study in 1 week Will refer to hematology to assist with second opinion and medication management as currently only on PO supplement and my benefit from IV iron infusions

## 2021-11-10 NOTE — Progress Notes (Signed)
New patient visit   Patient: Hannah Gallagher   DOB: 1970/07/29   51 y.o. Female  MRN: [id number] Visit Date: 11/10/2021  Today's healthcare provider: Jacky Kindle, FNP  Patient presents for new patient visit to establish care.  Introduced to Publishing rights manager role and practice setting.  All questions answered.  Discussed provider/patient relationship and expectations.   I,Tiffany J Bragg,acting as a scribe for Jacky Kindle, FNP.,have documented all relevant documentation on the behalf of Jacky Kindle, FNP,as directed by  Jacky Kindle, FNP while in the presence of Jacky Kindle, FNP.   Chief Complaint  Patient presents with   Anemia   Subjective    Hannah Gallagher is a 51 y.o. female who presents today as a new patient to establish care.  HPI    Past Medical History:  Diagnosis Date   Allergy    Anxiety    B12 deficiency    IDA (iron deficiency anemia)    Past Surgical History:  Procedure Laterality Date   COLONOSCOPY WITH PROPOFOL N/A 10/27/2021   Procedure: COLONOSCOPY WITH PROPOFOL;  Surgeon: Toney Reil, MD;  Location: [facility] ENDOSCOPY;  Service: Gastroenterology;  Laterality: N/A;   ELBOW SURGERY     ESOPHAGOGASTRODUODENOSCOPY N/A 10/27/2021   Procedure: ESOPHAGOGASTRODUODENOSCOPY (EGD);  Surgeon: Toney Reil, MD;  Location: [facility] ENDOSCOPY;  Service: Gastroenterology;  Laterality: N/A;   Family Status  Relation Name Status   Mother  Alive   Father  Deceased   MGM  (Not Specified)   MGF  (Not Specified)   PGF  (Not Specified)   Family History  Problem Relation Age of Onset   Hypertension Mother    Diabetes Mother    COPD Father    Congestive Heart Failure Father    Stroke Maternal Grandmother    Diabetes Maternal Grandmother    Hypertension Maternal Grandfather    COPD Paternal Grandfather    Congestive Heart Failure Paternal Grandfather    Social History   Socioeconomic History   Marital status: Single    Spouse name:  Not on file   Number of children: Not on file   Years of education: Not on file   Highest education level: Not on file  Occupational History   Not on file  Tobacco Use   Smoking status: Every Day    Types: Cigarettes   Smokeless tobacco: Never  Vaping Use   Vaping Use: Never used  Substance and Sexual Activity   Alcohol use: Yes    Comment: socially   Drug use: Yes    Types: Marijuana   Sexual activity: Not on file  Other Topics Concern   Not on file  Social History Narrative   Not on file   Social Determinants of Health   Financial Resource Strain: Not on file  Food Insecurity: Not on file  Transportation Needs: Not on file  Physical Activity: Not on file  Stress: Not on file  Social Connections: Not on file   Outpatient Medications Prior to Visit  Medication Sig   folic acid (FOLVITE) 1 MG tablet TAKE 2 TABLETS (2 MG TOTAL) BY MOUTH DAILY FOR 5 DAYS, THEN 1 TABLET (1 MG TOTAL) DAILY.   Iron-FA-B Cmp-C-Biot-Probiotic (FUSION PLUS) CAPS Take 1 capsule by mouth daily.   meloxicam (MOBIC) 15 MG tablet Take 15 mg by mouth daily as needed.   vitamin B-12 (CYANOCOBALAMIN) 1000 MCG tablet Take 1 tablet (1,000 mcg total) by mouth daily.   No  facility-administered medications prior to visit.   Allergies  Allergen Reactions   Amoxicillin      There is no immunization history on file for this patient.  Health Maintenance  Topic Date Due   COVID-19 Vaccine (1) Never done   HIV Screening  Never done   Hepatitis C Screening  Never done   TETANUS/TDAP  Never done   PAP SMEAR-Modifier  Never done   MAMMOGRAM  Never done   Zoster Vaccines- Shingrix (1 of 2) Never done   INFLUENZA VACCINE  01/12/2022   COLONOSCOPY (Pts 45-7714yrs Insurance coverage will need to be confirmed)  10/27/2024   HPV VACCINES  Aged Out    Patient Care Team: Jacky KindlePayne, Janelle Culton T, FNP as PCP - General (Family Medicine)  Review of Systems     Objective    BP 120/76 (BP Location: Right Arm, Patient  Position: Sitting, Cuff Size: Normal)   Pulse 85   Temp 98.6 F (37 C) (Oral)   Resp 16   Ht 5\' 4"  (1.626 m)   Wt 175 lb 4.8 oz (79.5 kg)   SpO2 100%   BMI 30.09 kg/m    Physical Exam Vitals and nursing note reviewed.  Constitutional:      General: She is not in acute distress.    Appearance: Normal appearance. She is obese. She is not ill-appearing, toxic-appearing or diaphoretic.  HENT:     Head: Normocephalic and atraumatic.  Cardiovascular:     Rate and Rhythm: Normal rate and regular rhythm.     Pulses: Normal pulses.     Heart sounds: Normal heart sounds. No murmur heard.   No friction rub. No gallop.  Pulmonary:     Effort: Pulmonary effort is normal. No respiratory distress.     Breath sounds: Normal breath sounds. No stridor. No wheezing, rhonchi or rales.  Chest:     Chest wall: No tenderness.  Abdominal:     General: Bowel sounds are normal.     Palpations: Abdomen is soft.  Musculoskeletal:        General: No swelling, tenderness, deformity or signs of injury. Normal range of motion.     Right lower leg: No edema.     Left lower leg: No edema.  Skin:    General: Skin is warm and dry.     Capillary Refill: Capillary refill takes less than 2 seconds.     Coloration: Skin is not jaundiced or pale.     Findings: No bruising, erythema, lesion or rash.  Neurological:     General: No focal deficit present.     Mental Status: She is alert and oriented to person, place, and time. Mental status is at baseline.     Cranial Nerves: No cranial nerve deficit.     Sensory: No sensory deficit.     Motor: No weakness.     Coordination: Coordination normal.  Psychiatric:        Mood and Affect: Mood normal.        Behavior: Behavior normal.        Thought Content: Thought content normal.        Judgment: Judgment normal.     Depression Screen    11/10/2021    1:53 PM  PHQ 2/9 Scores  PHQ - 2 Score 4  PHQ- 9 Score 11   No results found for any visits on  11/10/21.  Assessment & Plan      Problem List Items Addressed This Visit  Other   Iron deficiency anemia due to chronic blood loss - Primary    Chronic, recent exacerbation since 08/2021 Was seen in ER with prompted visit to GI Plan for capsule study in 1 week Will refer to hematology to assist with second opinion and medication management as currently only on PO supplement and my benefit from IV iron infusions        Relevant Orders   Ambulatory referral to Hematology / Oncology   Nicotine dependence with current use    Current tobacco use, via cigarettes Has been smoking 1/2 ppd for 30 years Has been working on cigarette reduction, is currently smoking 7-8 cigarettes/day Has never successfully quit prior Discussed use of step 3 nicotine patch with 3 cigarettes/day Discussed use of 1-2 mg gum Did not discuss use of chantix or Wellbutrin to assist        Positive screening for depression on 9-item Patient Health Questionnaire (PHQ-9)    PHQ results reviewed; denies SI or HI Encouraged to seek resources for medication or counseling Does not wish to seek further assistance regarding depression at this time         Return in about 6 months (around 05/13/2022) for annual examination.     Leilani Merl, FNP, have reviewed all documentation for this visit. The documentation on 11/10/21 for the exam, diagnosis, procedures, and orders are all accurate and complete.    Jacky Kindle, FNP  [facility] [phone] (phone) [phone] (fax)  [facility]

## 2021-11-10 NOTE — Assessment & Plan Note (Signed)
Current tobacco use, via cigarettes Has been smoking 1/2 ppd for 30 years Has been working on cigarette reduction, is currently smoking 7-8 cigarettes/day Has never successfully quit prior Discussed use of step 3 nicotine patch with 3 cigarettes/day Discussed use of 1-2 mg gum Did not discuss use of chantix or Wellbutrin to assist

## 2021-11-11 DIAGNOSIS — M5432 Sciatica, left side: Secondary | ICD-10-CM | POA: Diagnosis not present

## 2021-11-11 DIAGNOSIS — M9903 Segmental and somatic dysfunction of lumbar region: Secondary | ICD-10-CM | POA: Diagnosis not present

## 2021-11-11 DIAGNOSIS — M9905 Segmental and somatic dysfunction of pelvic region: Secondary | ICD-10-CM | POA: Diagnosis not present

## 2021-11-11 DIAGNOSIS — M5431 Sciatica, right side: Secondary | ICD-10-CM | POA: Diagnosis not present

## 2021-11-12 DIAGNOSIS — M9903 Segmental and somatic dysfunction of lumbar region: Secondary | ICD-10-CM | POA: Diagnosis not present

## 2021-11-12 DIAGNOSIS — M5431 Sciatica, right side: Secondary | ICD-10-CM | POA: Diagnosis not present

## 2021-11-12 DIAGNOSIS — M9905 Segmental and somatic dysfunction of pelvic region: Secondary | ICD-10-CM | POA: Diagnosis not present

## 2021-11-12 DIAGNOSIS — M5432 Sciatica, left side: Secondary | ICD-10-CM | POA: Diagnosis not present

## 2021-11-14 DIAGNOSIS — M5432 Sciatica, left side: Secondary | ICD-10-CM | POA: Diagnosis not present

## 2021-11-14 DIAGNOSIS — M9905 Segmental and somatic dysfunction of pelvic region: Secondary | ICD-10-CM | POA: Diagnosis not present

## 2021-11-14 DIAGNOSIS — M5431 Sciatica, right side: Secondary | ICD-10-CM | POA: Diagnosis not present

## 2021-11-14 DIAGNOSIS — M9903 Segmental and somatic dysfunction of lumbar region: Secondary | ICD-10-CM | POA: Diagnosis not present

## 2021-11-16 DIAGNOSIS — M9905 Segmental and somatic dysfunction of pelvic region: Secondary | ICD-10-CM | POA: Diagnosis not present

## 2021-11-16 DIAGNOSIS — M9903 Segmental and somatic dysfunction of lumbar region: Secondary | ICD-10-CM | POA: Diagnosis not present

## 2021-11-16 DIAGNOSIS — M5431 Sciatica, right side: Secondary | ICD-10-CM | POA: Diagnosis not present

## 2021-11-16 DIAGNOSIS — M5432 Sciatica, left side: Secondary | ICD-10-CM | POA: Diagnosis not present

## 2021-11-17 ENCOUNTER — Encounter
Admission: RE | Disposition: A | Discharge status: Home / Self Care | Payer: Self-pay | Source: Home / Self Care | Attending: Gastroenterology

## 2021-11-17 ENCOUNTER — Ambulatory Visit
Admission: RE | Admit: 2021-11-17 | Discharge: 2021-11-17 | Disposition: A | Discharge status: Home / Self Care | Payer: BC Managed Care – PPO | Attending: Gastroenterology | Admitting: Gastroenterology

## 2021-11-17 DIAGNOSIS — D649 Anemia, unspecified: Secondary | ICD-10-CM | POA: Diagnosis not present

## 2021-11-17 DIAGNOSIS — D509 Iron deficiency anemia, unspecified: Secondary | ICD-10-CM | POA: Diagnosis not present

## 2021-11-17 HISTORY — PX: GIVENS CAPSULE STUDY: SHX5432

## 2021-11-17 SURGERY — IMAGING PROCEDURE, GI TRACT, INTRALUMINAL, VIA CAPSULE

## 2021-11-17 MED ORDER — PHENYLEPHRINE HCL (PRESSORS) 10 MG/ML IV SOLN
INTRAVENOUS | Status: AC
Start: 2021-11-17 — End: ?
  Filled 2021-11-17: qty 1

## 2021-11-17 MED ORDER — PROPOFOL 1000 MG/100ML IV EMUL
INTRAVENOUS | Status: AC
  Filled 2021-11-17: qty 100

## 2021-11-18 ENCOUNTER — Encounter: Payer: Self-pay | Admitting: Gastroenterology

## 2021-11-18 DIAGNOSIS — M5432 Sciatica, left side: Secondary | ICD-10-CM | POA: Diagnosis not present

## 2021-11-18 DIAGNOSIS — M5431 Sciatica, right side: Secondary | ICD-10-CM | POA: Diagnosis not present

## 2021-11-18 DIAGNOSIS — M9903 Segmental and somatic dysfunction of lumbar region: Secondary | ICD-10-CM | POA: Diagnosis not present

## 2021-11-18 DIAGNOSIS — M9905 Segmental and somatic dysfunction of pelvic region: Secondary | ICD-10-CM | POA: Diagnosis not present

## 2021-11-20 DIAGNOSIS — M9905 Segmental and somatic dysfunction of pelvic region: Secondary | ICD-10-CM | POA: Diagnosis not present

## 2021-11-20 DIAGNOSIS — M9903 Segmental and somatic dysfunction of lumbar region: Secondary | ICD-10-CM | POA: Diagnosis not present

## 2021-11-20 DIAGNOSIS — M5431 Sciatica, right side: Secondary | ICD-10-CM | POA: Diagnosis not present

## 2021-11-20 DIAGNOSIS — M5432 Sciatica, left side: Secondary | ICD-10-CM | POA: Diagnosis not present

## 2021-11-23 DIAGNOSIS — M9903 Segmental and somatic dysfunction of lumbar region: Secondary | ICD-10-CM | POA: Diagnosis not present

## 2021-11-23 DIAGNOSIS — M5432 Sciatica, left side: Secondary | ICD-10-CM | POA: Diagnosis not present

## 2021-11-23 DIAGNOSIS — M9905 Segmental and somatic dysfunction of pelvic region: Secondary | ICD-10-CM | POA: Diagnosis not present

## 2021-11-23 DIAGNOSIS — M5431 Sciatica, right side: Secondary | ICD-10-CM | POA: Diagnosis not present

## 2021-11-25 DIAGNOSIS — M9903 Segmental and somatic dysfunction of lumbar region: Secondary | ICD-10-CM | POA: Diagnosis not present

## 2021-11-25 DIAGNOSIS — M5431 Sciatica, right side: Secondary | ICD-10-CM | POA: Diagnosis not present

## 2021-11-25 DIAGNOSIS — M5432 Sciatica, left side: Secondary | ICD-10-CM | POA: Diagnosis not present

## 2021-11-25 DIAGNOSIS — M9905 Segmental and somatic dysfunction of pelvic region: Secondary | ICD-10-CM | POA: Diagnosis not present

## 2021-11-26 DIAGNOSIS — M5431 Sciatica, right side: Secondary | ICD-10-CM | POA: Diagnosis not present

## 2021-11-26 DIAGNOSIS — M9905 Segmental and somatic dysfunction of pelvic region: Secondary | ICD-10-CM | POA: Diagnosis not present

## 2021-11-26 DIAGNOSIS — M5432 Sciatica, left side: Secondary | ICD-10-CM | POA: Diagnosis not present

## 2021-11-26 DIAGNOSIS — M9903 Segmental and somatic dysfunction of lumbar region: Secondary | ICD-10-CM | POA: Diagnosis not present

## 2021-11-27 ENCOUNTER — Inpatient Hospital Stay: Payer: BC Managed Care – PPO | Attending: Oncology | Admitting: Oncology

## 2021-11-27 ENCOUNTER — Other Ambulatory Visit: Payer: Self-pay

## 2021-11-27 ENCOUNTER — Encounter: Payer: Self-pay | Admitting: Oncology

## 2021-11-27 ENCOUNTER — Inpatient Hospital Stay: Payer: BC Managed Care – PPO

## 2021-11-27 VITALS — BP 131/86 | HR 69 | Temp 97.6°F | Resp 18 | Ht 64.0 in | Wt 169.3 lb

## 2021-11-27 DIAGNOSIS — Z833 Family history of diabetes mellitus: Secondary | ICD-10-CM | POA: Insufficient documentation

## 2021-11-27 DIAGNOSIS — R5383 Other fatigue: Secondary | ICD-10-CM | POA: Diagnosis not present

## 2021-11-27 DIAGNOSIS — F17210 Nicotine dependence, cigarettes, uncomplicated: Secondary | ICD-10-CM | POA: Insufficient documentation

## 2021-11-27 DIAGNOSIS — Z880 Allergy status to penicillin: Secondary | ICD-10-CM | POA: Insufficient documentation

## 2021-11-27 DIAGNOSIS — Z836 Family history of other diseases of the respiratory system: Secondary | ICD-10-CM | POA: Diagnosis not present

## 2021-11-27 DIAGNOSIS — D500 Iron deficiency anemia secondary to blood loss (chronic): Secondary | ICD-10-CM | POA: Insufficient documentation

## 2021-11-27 DIAGNOSIS — Z8249 Family history of ischemic heart disease and other diseases of the circulatory system: Secondary | ICD-10-CM | POA: Insufficient documentation

## 2021-11-27 DIAGNOSIS — Z823 Family history of stroke: Secondary | ICD-10-CM | POA: Insufficient documentation

## 2021-11-27 DIAGNOSIS — K644 Residual hemorrhoidal skin tags: Secondary | ICD-10-CM | POA: Diagnosis not present

## 2021-11-27 DIAGNOSIS — Z79899 Other long term (current) drug therapy: Secondary | ICD-10-CM | POA: Insufficient documentation

## 2021-11-27 LAB — COMPREHENSIVE METABOLIC PANEL
ALT: 10 U/L (ref 0–44)
AST: 18 U/L (ref 15–41)
Albumin: 3.2 g/dL — ABNORMAL LOW (ref 3.5–5.0)
Alkaline Phosphatase: 82 U/L (ref 38–126)
Anion gap: 7 (ref 5–15)
BUN: <5 mg/dL — ABNORMAL LOW (ref 6–20)
CO2: 28 mmol/L (ref 22–32)
Calcium: 8.4 mg/dL — ABNORMAL LOW (ref 8.9–10.3)
Chloride: 102 mmol/L (ref 98–111)
Creatinine, Ser: 0.6 mg/dL (ref 0.44–1.00)
GFR, Estimated: >60 mL/min (ref >60)
Glucose, Bld: 80 mg/dL (ref 70–99)
Potassium: 3.7 mmol/L (ref 3.5–5.1)
Sodium: 137 mmol/L (ref 135–145)
Total Bilirubin: 0.4 mg/dL (ref 0.3–1.2)
Total Protein: 6.8 g/dL (ref 6.5–8.1)

## 2021-11-27 LAB — CBC WITH DIFFERENTIAL/PLATELET
Abs Immature Granulocytes: 0.02 10*3/uL (ref 0.00–0.07)
Basophils Absolute: 0 10*3/uL (ref 0.0–0.1)
Basophils Relative: 0 %
Eosinophils Absolute: 0.1 10*3/uL (ref 0.0–0.5)
Eosinophils Relative: 1 %
HCT: 35.7 % — ABNORMAL LOW (ref 36.0–46.0)
Hemoglobin: 10.8 g/dL — ABNORMAL LOW (ref 12.0–15.0)
Immature Granulocytes: 0 %
Lymphocytes Relative: 27 %
Lymphs Abs: 2 10*3/uL (ref 0.7–4.0)
MCH: 26.3 pg (ref 26.0–34.0)
MCHC: 30.3 g/dL (ref 30.0–36.0)
MCV: 86.9 fL (ref 80.0–100.0)
Monocytes Absolute: 0.4 10*3/uL (ref 0.1–1.0)
Monocytes Relative: 6 %
Neutro Abs: 4.8 10*3/uL (ref 1.7–7.7)
Neutrophils Relative %: 66 %
Platelets: 267 10*3/uL (ref 150–400)
RBC: 4.11 MIL/uL (ref 3.87–5.11)
RDW: 19.8 % — ABNORMAL HIGH (ref 11.5–15.5)
WBC: 7.2 10*3/uL (ref 4.0–10.5)
nRBC: 0 % (ref 0.0–0.2)

## 2021-11-27 LAB — RETIC PANEL
Immature Retic Fract: 12.8 % (ref 2.3–15.9)
RBC.: 4.05 MIL/uL (ref 3.87–5.11)
Retic Count, Absolute: 59.9 10*3/uL (ref 19.0–186.0)
Retic Ct Pct: 1.5 % (ref 0.4–3.1)
Reticulocyte Hemoglobin: 29.4 pg (ref >27.9)

## 2021-11-27 LAB — IRON AND TIBC
Iron: 20 ug/dL — ABNORMAL LOW (ref 28–170)
Saturation Ratios: 6 % — ABNORMAL LOW (ref 10.4–31.8)
TIBC: 329 ug/dL (ref 250–450)
UIBC: 309 ug/dL

## 2021-11-27 LAB — TECHNOLOGIST SMEAR REVIEW
Plt Morphology: NORMAL
RBC MORPHOLOGY: NORMAL
WBC MORPHOLOGY: NORMAL

## 2021-11-27 LAB — FERRITIN: Ferritin: 10 ng/mL — ABNORMAL LOW (ref 11–307)

## 2021-11-27 NOTE — Progress Notes (Signed)
Hematology/Oncology Consult note [phone] [phone]      Patient Care Team: Jacky Kindle, FNP as PCP - General (Family Medicine)   REFERRING PROVIDER: Jacky Kindle, FNP  CHIEF COMPLAINTS/REASON FOR VISIT:  Anemia  ASSESSMENT & PLAN:  Iron deficiency anemia due to chronic blood loss Check cbc, iron tibc ferritin.  I discussed about option of continue oral iron supplementation and repeat blood work for evaluation of treatment response.  If no significant improvement, then proceed with IV Venofer treatments. Alternative option of IV Venofer treatments was discussed. I discussed about the potential risks including but not limited to allergic reactions/infusion reactions including anaphylactic reactions, phlebitis, high blood pressure, wheezing, SOB, skin rash, weight gain, leg swelling, headache, nausea and fatigue, etc.   Today's blood work findings are consistent with iron deficiency anemia, Hemoglobin and iron store are better. Will contact patient to see if she would like to continue oral iron supplementation or to proceed with IV venofer.   Orders Placed This Encounter  Procedures   Ferritin    Standing Status:   Future    Number of Occurrences:   1    Standing Expiration Date:   05/29/2022   Iron and TIBC    Standing Status:   Future    Number of Occurrences:   1    Standing Expiration Date:   11/28/2022   CBC with Differential/Platelet    Standing Status:   Future    Number of Occurrences:   1    Standing Expiration Date:   11/28/2022   Retic Panel    Standing Status:   Future    Number of Occurrences:   1    Standing Expiration Date:   11/28/2022   Technologist smear review    Standing Status:   Future    Number of Occurrences:   1    Standing Expiration Date:   11/28/2022   Comprehensive metabolic panel    Standing Status:   Future    Number of Occurrences:   1    Standing Expiration Date:   11/28/2022   Follow up TBD All questions  were answered. The patient knows to call the clinic with any problems, questions or concerns.  Rickard Patience, MD, PhD [facility] Hematology Oncology 11/27/2021     HISTORY OF PRESENTING ILLNESS:  Hannah Gallagher is a  51 y.o.  female with PMH listed below who was referred to me for anemia Reviewed patient's recent labs that was done.  10/08/21 CBC showed hemoglobin of 9.4, mcv 79, ferritin 11, iron saturation 7.   She denies recent chest pain on exertion, shortness of breath on minimal exertion, pre-syncopal episodes, or palpitations She had not noticed any recent bleeding such as epistaxis, hematuria or hematochezia.  She denies over the counter NSAID ingestion.  Her last colonoscopy was 10/27/21, -Preparation of the colon was fair.- The examined portion of the ileum was normal. - Three 3 to 4 mm polyps at the recto-sigmoid colon, removed with a cold snare. Resected and retrieved. - Non-bleeding external hemorrhoids.  -pathology showed hyperplastic polyps.   EGD on Erosive gastropathy with no bleeding and no stigmata of recent bleeding. Biopsied.- Normal gastric body and incisura. Biopsied.- Esophagogastric landmarks identified.- Normal gastroesophageal junction and esophagus. Pathology showed antral mucosa with mild reactive gastritis. Negative for H Pylori, intestinal metaplasia, dysplasia and malignancy.   She denies any pica and eats a variety of diet. The patient was prescribed oral iron supplements and she takes  Fusion plus.    MEDICAL HISTORY:  Past Medical History:  Diagnosis Date   Allergy    Anxiety    B12 deficiency    IDA (iron deficiency anemia)     SURGICAL HISTORY: Past Surgical History:  Procedure Laterality Date   COLONOSCOPY WITH PROPOFOL N/A 10/27/2021   Procedure: COLONOSCOPY WITH PROPOFOL;  Surgeon: Toney Reil, MD;  Location: [facility] ENDOSCOPY;  Service: Gastroenterology;  Laterality: N/A;   ELBOW SURGERY     ESOPHAGOGASTRODUODENOSCOPY N/A  10/27/2021   Procedure: ESOPHAGOGASTRODUODENOSCOPY (EGD);  Surgeon: Toney Reil, MD;  Location: [facility] ENDOSCOPY;  Service: Gastroenterology;  Laterality: N/A;   GIVENS CAPSULE STUDY N/A 11/17/2021   Procedure: GIVENS CAPSULE STUDY;  Surgeon: Toney Reil, MD;  Location: [facility] ENDOSCOPY;  Service: Gastroenterology;  Laterality: N/A;   TUBAL LIGATION Bilateral 01/1993    SOCIAL HISTORY: Social History   Socioeconomic History   Marital status: Single    Spouse name: Not on file   Number of children: Not on file   Years of education: Not on file   Highest education level: Not on file  Occupational History   Not on file  Tobacco Use   Smoking status: Every Day    Packs/day: 0.50    Years: 27.00    Total pack years: 13.50    Types: Cigarettes   Smokeless tobacco: Never  Vaping Use   Vaping Use: Never used  Substance and Sexual Activity   Alcohol use: Yes    Comment: socially   Drug use: Not Currently    Types: Marijuana   Sexual activity: Not on file  Other Topics Concern   Not on file  Social History Narrative   Not on file   Social Determinants of Health   Financial Resource Strain: Not on file  Food Insecurity: Not on file  Transportation Needs: Not on file  Physical Activity: Not on file  Stress: Not on file  Social Connections: Not on file  Intimate Partner Violence: Not on file    FAMILY HISTORY: Family History  Problem Relation Age of Onset   Hypertension Mother    Diabetes Mother    COPD Father    Congestive Heart Failure Father    Stroke Maternal Grandmother    Diabetes Maternal Grandmother    Hypertension Maternal Grandfather    COPD Paternal Grandfather    Congestive Heart Failure Paternal Grandfather     ALLERGIES:  is allergic to amoxicillin.  MEDICATIONS:  Current Outpatient Medications  Medication Sig Dispense Refill   folic acid (FOLVITE) 1 MG tablet TAKE 2 TABLETS (2 MG TOTAL) BY MOUTH DAILY FOR 5 DAYS, THEN 1 TABLET (1 MG  TOTAL) DAILY. 40 tablet 0   Iron-FA-B Cmp-C-Biot-Probiotic (FUSION PLUS) CAPS Take 1 capsule by mouth daily. 30 capsule 5   meloxicam (MOBIC) 15 MG tablet Take 15 mg by mouth daily as needed.     vitamin B-12 (CYANOCOBALAMIN) 1000 MCG tablet Take 1 tablet (1,000 mcg total) by mouth daily. 30 tablet 3   No current facility-administered medications for this visit.    Review of Systems  Constitutional:  Positive for fatigue. Negative for appetite change, chills and fever.  HENT:   Negative for hearing loss and voice change.   Eyes:  Negative for eye problems.  Respiratory:  Negative for chest tightness and cough.   Cardiovascular:  Negative for chest pain.  Gastrointestinal:  Negative for abdominal distention, abdominal pain and blood in stool.  Endocrine: Negative for hot flashes.  Genitourinary:  Positive for menstrual problem. Negative for difficulty urinating and frequency.   Musculoskeletal:  Negative for arthralgias.  Skin:  Negative for itching and rash.  Neurological:  Negative for extremity weakness.  Hematological:  Negative for adenopathy.  Psychiatric/Behavioral:  Negative for confusion.     PHYSICAL EXAMINATION:  Vitals:   11/27/21 0930  BP: 131/86  Pulse: 69  Resp: 18  Temp: 97.6 F (36.4 C)   Filed Weights   11/27/21 0930  Weight: 169 lb 4.8 oz (76.8 kg)    Physical Exam Constitutional:      General: She is not in acute distress. HENT:     Head: Normocephalic and atraumatic.  Eyes:     General: No scleral icterus. Cardiovascular:     Rate and Rhythm: Normal rate and regular rhythm.     Heart sounds: Normal heart sounds.  Pulmonary:     Effort: Pulmonary effort is normal. No respiratory distress.     Breath sounds: No wheezing.  Abdominal:     General: Bowel sounds are normal. There is no distension.     Palpations: Abdomen is soft.  Musculoskeletal:        General: No deformity. Normal range of motion.     Cervical back: Normal range of motion and  neck supple.  Skin:    General: Skin is warm and dry.     Findings: No erythema or rash.  Neurological:     Mental Status: She is alert and oriented to person, place, and time. Mental status is at baseline.     Cranial Nerves: No cranial nerve deficit.     Coordination: Coordination normal.  Psychiatric:        Mood and Affect: Mood normal.      LABORATORY DATA:  I have reviewed the data as listed Lab Results  Component Value Date   WBC 7.2 11/27/2021   HGB 10.8 (L) 11/27/2021   HCT 35.7 (L) 11/27/2021   MCV 86.9 11/27/2021   PLT 267 11/27/2021   Lab Results  Component Value Date   NA 137 11/27/2021   K 3.7 11/27/2021   CL 102 11/27/2021   CO2 28 11/27/2021      Component Value Date/Time   IRON 20 (L) 11/27/2021 1009   IRON 24 (L) 10/08/2021 1413   TIBC 329 11/27/2021 1009   TIBC 368 10/08/2021 1413   FERRITIN 10 (L) 11/27/2021 1009   FERRITIN 11 (L) 10/08/2021 1413   IRONPCTSAT 6 (L) 11/27/2021 1009   IRONPCTSAT 7 (LL) 10/08/2021 1413     RADIOGRAPHIC STUDIES: I have personally reviewed the radiological images as listed and agreed with the findings in the report. No results found.

## 2021-11-27 NOTE — Progress Notes (Signed)
Patient here for iron deficiency anemia. She has been taking fusion plus capsules .

## 2021-11-27 NOTE — Assessment & Plan Note (Signed)
Check cbc, iron tibc ferritin.  I discussed about option of continue oral iron supplementation and repeat blood work for evaluation of treatment response.  If no significant improvement, then proceed with IV Venofer treatments. Alternative option of IV Venofer treatments was discussed. I discussed about the potential risks including but not limited to allergic reactions/infusion reactions including anaphylactic reactions, phlebitis, high blood pressure, wheezing, SOB, skin rash, weight gain, leg swelling, headache, nausea and fatigue, etc.   Today's blood work findings are consistent with iron deficiency anemia, Hemoglobin and iron store are better. Will contact patient to see if she would like to continue oral iron supplementation or to proceed with IV venofer.

## 2021-11-30 ENCOUNTER — Telehealth: Payer: Self-pay

## 2021-11-30 DIAGNOSIS — D500 Iron deficiency anemia secondary to blood loss (chronic): Secondary | ICD-10-CM

## 2021-11-30 NOTE — Telephone Encounter (Signed)
Called and informed patient of lab results and Dr. Bethanne Ginger recommendation. Patient would like to start IV venofer weekly x 4.    Abby, please schedule patient for: IV venofer weekly x 4 Labs in 3 months MD/+/- venofer 2 days after  Please notify patient of appt. Thanks

## 2021-11-30 NOTE — Telephone Encounter (Signed)
-----   Message from Rickard Patience, MD sent at 11/27/2021 10:37 PM EDT ----- Las are consistent with iron deficiency anemia, hemoglobin is slightly better.  Please let her know that she could consider IV venofer weekly x 4 if she desires to achieve higher level of blood count and iron level faster.  Alternative is to continue iron supplementation and if repeat blood work is still low in 3 months, to proceed with iv iron.  Please arrange her to follow up in 3 months. Labs prior to MD + Venofer. - ordered

## 2021-12-01 DIAGNOSIS — M5432 Sciatica, left side: Secondary | ICD-10-CM | POA: Diagnosis not present

## 2021-12-01 DIAGNOSIS — M9905 Segmental and somatic dysfunction of pelvic region: Secondary | ICD-10-CM | POA: Diagnosis not present

## 2021-12-01 DIAGNOSIS — M5431 Sciatica, right side: Secondary | ICD-10-CM | POA: Diagnosis not present

## 2021-12-01 DIAGNOSIS — M9903 Segmental and somatic dysfunction of lumbar region: Secondary | ICD-10-CM | POA: Diagnosis not present

## 2021-12-03 DIAGNOSIS — M5432 Sciatica, left side: Secondary | ICD-10-CM | POA: Diagnosis not present

## 2021-12-03 DIAGNOSIS — M9905 Segmental and somatic dysfunction of pelvic region: Secondary | ICD-10-CM | POA: Diagnosis not present

## 2021-12-03 DIAGNOSIS — M5431 Sciatica, right side: Secondary | ICD-10-CM | POA: Diagnosis not present

## 2021-12-03 DIAGNOSIS — M9903 Segmental and somatic dysfunction of lumbar region: Secondary | ICD-10-CM | POA: Diagnosis not present

## 2021-12-04 ENCOUNTER — Encounter: Payer: Self-pay | Admitting: Oncology

## 2021-12-07 ENCOUNTER — Other Ambulatory Visit: Payer: Self-pay | Admitting: Gastroenterology

## 2021-12-08 ENCOUNTER — Inpatient Hospital Stay: Payer: BC Managed Care – PPO

## 2021-12-08 VITALS — BP 105/65 | HR 75 | Temp 98.4°F | Resp 18

## 2021-12-08 DIAGNOSIS — Z823 Family history of stroke: Secondary | ICD-10-CM | POA: Diagnosis not present

## 2021-12-08 DIAGNOSIS — Z8249 Family history of ischemic heart disease and other diseases of the circulatory system: Secondary | ICD-10-CM | POA: Diagnosis not present

## 2021-12-08 DIAGNOSIS — Z836 Family history of other diseases of the respiratory system: Secondary | ICD-10-CM | POA: Diagnosis not present

## 2021-12-08 DIAGNOSIS — R5383 Other fatigue: Secondary | ICD-10-CM | POA: Diagnosis not present

## 2021-12-08 DIAGNOSIS — Z79899 Other long term (current) drug therapy: Secondary | ICD-10-CM | POA: Diagnosis not present

## 2021-12-08 DIAGNOSIS — K644 Residual hemorrhoidal skin tags: Secondary | ICD-10-CM | POA: Diagnosis not present

## 2021-12-08 DIAGNOSIS — M5432 Sciatica, left side: Secondary | ICD-10-CM | POA: Diagnosis not present

## 2021-12-08 DIAGNOSIS — D500 Iron deficiency anemia secondary to blood loss (chronic): Secondary | ICD-10-CM | POA: Diagnosis not present

## 2021-12-08 DIAGNOSIS — F17210 Nicotine dependence, cigarettes, uncomplicated: Secondary | ICD-10-CM | POA: Diagnosis not present

## 2021-12-08 DIAGNOSIS — M9903 Segmental and somatic dysfunction of lumbar region: Secondary | ICD-10-CM | POA: Diagnosis not present

## 2021-12-08 DIAGNOSIS — M5431 Sciatica, right side: Secondary | ICD-10-CM | POA: Diagnosis not present

## 2021-12-08 DIAGNOSIS — M9905 Segmental and somatic dysfunction of pelvic region: Secondary | ICD-10-CM | POA: Diagnosis not present

## 2021-12-08 DIAGNOSIS — Z833 Family history of diabetes mellitus: Secondary | ICD-10-CM | POA: Diagnosis not present

## 2021-12-08 DIAGNOSIS — Z880 Allergy status to penicillin: Secondary | ICD-10-CM | POA: Diagnosis not present

## 2021-12-08 MED ORDER — IRON SUCROSE 20 MG/ML IV SOLN
200.0000 mg | Freq: Once | INTRAVENOUS | Status: AC
  Administered 2021-12-08: 200 mg via INTRAVENOUS
  Filled 2021-12-08: qty 10

## 2021-12-08 MED ORDER — SODIUM CHLORIDE 0.9 % IV SOLN
Freq: Once | INTRAVENOUS | Status: AC
  Filled 2021-12-08: qty 250

## 2021-12-08 MED ORDER — SODIUM CHLORIDE 0.9 % IV SOLN: 200.0000 mg | Freq: Once | INTRAVENOUS | Status: DC

## 2021-12-10 DIAGNOSIS — M9903 Segmental and somatic dysfunction of lumbar region: Secondary | ICD-10-CM | POA: Diagnosis not present

## 2021-12-10 DIAGNOSIS — M9905 Segmental and somatic dysfunction of pelvic region: Secondary | ICD-10-CM | POA: Diagnosis not present

## 2021-12-10 DIAGNOSIS — M5432 Sciatica, left side: Secondary | ICD-10-CM | POA: Diagnosis not present

## 2021-12-10 DIAGNOSIS — M5431 Sciatica, right side: Secondary | ICD-10-CM | POA: Diagnosis not present

## 2021-12-14 ENCOUNTER — Inpatient Hospital Stay: Payer: BC Managed Care – PPO | Attending: Oncology

## 2021-12-14 VITALS — BP 117/77 | HR 70 | Temp 96.8°F | Resp 20

## 2021-12-14 DIAGNOSIS — Z79899 Other long term (current) drug therapy: Secondary | ICD-10-CM | POA: Diagnosis not present

## 2021-12-14 DIAGNOSIS — M9905 Segmental and somatic dysfunction of pelvic region: Secondary | ICD-10-CM | POA: Diagnosis not present

## 2021-12-14 DIAGNOSIS — N920 Excessive and frequent menstruation with regular cycle: Secondary | ICD-10-CM | POA: Insufficient documentation

## 2021-12-14 DIAGNOSIS — D500 Iron deficiency anemia secondary to blood loss (chronic): Secondary | ICD-10-CM | POA: Diagnosis not present

## 2021-12-14 DIAGNOSIS — M9903 Segmental and somatic dysfunction of lumbar region: Secondary | ICD-10-CM | POA: Diagnosis not present

## 2021-12-14 DIAGNOSIS — M5431 Sciatica, right side: Secondary | ICD-10-CM | POA: Diagnosis not present

## 2021-12-14 DIAGNOSIS — M5432 Sciatica, left side: Secondary | ICD-10-CM | POA: Diagnosis not present

## 2021-12-14 MED ORDER — SODIUM CHLORIDE 0.9 % IV SOLN
Freq: Once | INTRAVENOUS | Status: AC
  Filled 2021-12-14: qty 250

## 2021-12-14 MED ORDER — SODIUM CHLORIDE 0.9 % IV SOLN
200.0000 mg | Freq: Once | INTRAVENOUS | Status: AC
  Administered 2021-12-14: 200 mg via INTRAVENOUS
  Filled 2021-12-14: qty 10

## 2021-12-17 DIAGNOSIS — M9903 Segmental and somatic dysfunction of lumbar region: Secondary | ICD-10-CM | POA: Diagnosis not present

## 2021-12-17 DIAGNOSIS — M9905 Segmental and somatic dysfunction of pelvic region: Secondary | ICD-10-CM | POA: Diagnosis not present

## 2021-12-17 DIAGNOSIS — M5432 Sciatica, left side: Secondary | ICD-10-CM | POA: Diagnosis not present

## 2021-12-17 DIAGNOSIS — M5431 Sciatica, right side: Secondary | ICD-10-CM | POA: Diagnosis not present

## 2021-12-22 ENCOUNTER — Inpatient Hospital Stay: Payer: BC Managed Care – PPO

## 2021-12-22 VITALS — BP 108/72 | HR 62 | Temp 97.6°F | Resp 16

## 2021-12-22 DIAGNOSIS — D500 Iron deficiency anemia secondary to blood loss (chronic): Secondary | ICD-10-CM | POA: Diagnosis not present

## 2021-12-22 DIAGNOSIS — M5431 Sciatica, right side: Secondary | ICD-10-CM | POA: Diagnosis not present

## 2021-12-22 DIAGNOSIS — N920 Excessive and frequent menstruation with regular cycle: Secondary | ICD-10-CM | POA: Diagnosis not present

## 2021-12-22 DIAGNOSIS — M9905 Segmental and somatic dysfunction of pelvic region: Secondary | ICD-10-CM | POA: Diagnosis not present

## 2021-12-22 DIAGNOSIS — Z79899 Other long term (current) drug therapy: Secondary | ICD-10-CM | POA: Diagnosis not present

## 2021-12-22 DIAGNOSIS — M9903 Segmental and somatic dysfunction of lumbar region: Secondary | ICD-10-CM | POA: Diagnosis not present

## 2021-12-22 DIAGNOSIS — M5432 Sciatica, left side: Secondary | ICD-10-CM | POA: Diagnosis not present

## 2021-12-22 MED ORDER — SODIUM CHLORIDE 0.9 % IV SOLN
Freq: Once | INTRAVENOUS | Status: AC
  Filled 2021-12-22: qty 250

## 2021-12-22 MED ORDER — SODIUM CHLORIDE 0.9 % IV SOLN
200.0000 mg | Freq: Once | INTRAVENOUS | Status: AC
  Administered 2021-12-22: 200 mg via INTRAVENOUS
  Filled 2021-12-22: qty 10

## 2021-12-24 DIAGNOSIS — M5432 Sciatica, left side: Secondary | ICD-10-CM | POA: Diagnosis not present

## 2021-12-24 DIAGNOSIS — M9905 Segmental and somatic dysfunction of pelvic region: Secondary | ICD-10-CM | POA: Diagnosis not present

## 2021-12-24 DIAGNOSIS — M5431 Sciatica, right side: Secondary | ICD-10-CM | POA: Diagnosis not present

## 2021-12-24 DIAGNOSIS — M9903 Segmental and somatic dysfunction of lumbar region: Secondary | ICD-10-CM | POA: Diagnosis not present

## 2021-12-29 ENCOUNTER — Inpatient Hospital Stay: Payer: BC Managed Care – PPO

## 2021-12-29 VITALS — BP 120/78 | HR 68 | Temp 97.6°F | Resp 17

## 2021-12-29 DIAGNOSIS — D500 Iron deficiency anemia secondary to blood loss (chronic): Secondary | ICD-10-CM

## 2021-12-29 DIAGNOSIS — M5432 Sciatica, left side: Secondary | ICD-10-CM | POA: Diagnosis not present

## 2021-12-29 DIAGNOSIS — Z79899 Other long term (current) drug therapy: Secondary | ICD-10-CM | POA: Diagnosis not present

## 2021-12-29 DIAGNOSIS — N920 Excessive and frequent menstruation with regular cycle: Secondary | ICD-10-CM | POA: Diagnosis not present

## 2021-12-29 DIAGNOSIS — M9905 Segmental and somatic dysfunction of pelvic region: Secondary | ICD-10-CM | POA: Diagnosis not present

## 2021-12-29 DIAGNOSIS — M5431 Sciatica, right side: Secondary | ICD-10-CM | POA: Diagnosis not present

## 2021-12-29 DIAGNOSIS — M9903 Segmental and somatic dysfunction of lumbar region: Secondary | ICD-10-CM | POA: Diagnosis not present

## 2021-12-29 MED ORDER — SODIUM CHLORIDE 0.9 % IV SOLN
Freq: Once | INTRAVENOUS | Status: AC
  Filled 2021-12-29: qty 250

## 2021-12-29 MED ORDER — SODIUM CHLORIDE 0.9 % IV SOLN
200.0000 mg | Freq: Once | INTRAVENOUS | Status: AC
  Administered 2021-12-29: 200 mg via INTRAVENOUS
  Filled 2021-12-29: qty 200

## 2021-12-29 NOTE — Patient Instructions (Signed)

## 2021-12-31 DIAGNOSIS — M5432 Sciatica, left side: Secondary | ICD-10-CM | POA: Diagnosis not present

## 2021-12-31 DIAGNOSIS — M9903 Segmental and somatic dysfunction of lumbar region: Secondary | ICD-10-CM | POA: Diagnosis not present

## 2021-12-31 DIAGNOSIS — M5431 Sciatica, right side: Secondary | ICD-10-CM | POA: Diagnosis not present

## 2021-12-31 DIAGNOSIS — M9905 Segmental and somatic dysfunction of pelvic region: Secondary | ICD-10-CM | POA: Diagnosis not present

## 2022-01-06 DIAGNOSIS — M5432 Sciatica, left side: Secondary | ICD-10-CM | POA: Diagnosis not present

## 2022-01-06 DIAGNOSIS — M9903 Segmental and somatic dysfunction of lumbar region: Secondary | ICD-10-CM | POA: Diagnosis not present

## 2022-01-06 DIAGNOSIS — M9905 Segmental and somatic dysfunction of pelvic region: Secondary | ICD-10-CM | POA: Diagnosis not present

## 2022-01-06 DIAGNOSIS — M5431 Sciatica, right side: Secondary | ICD-10-CM | POA: Diagnosis not present

## 2022-01-13 ENCOUNTER — Ambulatory Visit (INDEPENDENT_AMBULATORY_CARE_PROVIDER_SITE_OTHER): Payer: BC Managed Care – PPO | Admitting: Gastroenterology

## 2022-01-13 ENCOUNTER — Encounter: Payer: Self-pay | Admitting: Gastroenterology

## 2022-01-13 VITALS — BP 143/83 | HR 73 | Temp 98.4°F | Ht 64.0 in | Wt 174.0 lb

## 2022-01-13 DIAGNOSIS — M5432 Sciatica, left side: Secondary | ICD-10-CM | POA: Diagnosis not present

## 2022-01-13 DIAGNOSIS — K644 Residual hemorrhoidal skin tags: Secondary | ICD-10-CM

## 2022-01-13 DIAGNOSIS — K640 First degree hemorrhoids: Secondary | ICD-10-CM

## 2022-01-13 DIAGNOSIS — M9903 Segmental and somatic dysfunction of lumbar region: Secondary | ICD-10-CM | POA: Diagnosis not present

## 2022-01-13 DIAGNOSIS — M9905 Segmental and somatic dysfunction of pelvic region: Secondary | ICD-10-CM | POA: Diagnosis not present

## 2022-01-13 DIAGNOSIS — M5431 Sciatica, right side: Secondary | ICD-10-CM | POA: Diagnosis not present

## 2022-01-13 DIAGNOSIS — K5909 Other constipation: Secondary | ICD-10-CM | POA: Diagnosis not present

## 2022-01-13 MED ORDER — HYDROCORTISONE (PERIANAL) 2.5 % EX CREA: 1.0000 | TOPICAL_CREAM | Freq: Two times a day (BID) | CUTANEOUS | 0 refills | Status: DC

## 2022-01-13 NOTE — Progress Notes (Signed)
Arlyss Repress, MD [address]  [address]  [place], [place] [address]  Main: [phone]  Fax: [phone]    Gastroenterology Consultation  Referring Provider:     No ref. provider found Primary Care Physician:  Jacky Kindle, FNP Primary Gastroenterologist:  Dr. Arlyss Repress Reason for Consultation: Iron deficiency anemia, new onset of constipation        HPI:   Starla Deller is a 51 y.o. female referred by Dr. Jacky Kindle, FNP  for consultation & management of new onset of constipation which has started since she had oral surgery in February.  After the surgery, she was not able to eat regular foods, was managing on soft diet only.  She developed severe constipation and was taking liquid Dulcolax.  She developed severe low back pain that prompted her to go to ER on 08/31/2021.  Labs revealed microcytic anemia, hemoglobin 10, MCV 77, CMP normal, UA and urine culture were negative.  Abdominal x-ray did not reveal any significant fecal material.  Patient is therefore referred to GI for further evaluation.  Patient reports that since then she is trying to eat more fiber, cut back on all the sodas.  She does admit to drinking sweet tea and ginger ale along with water.  She does not smoke or drink alcohol.  She works from home and she has lost some weight.  She does reports feeling bloated and feels something down in the rectum area and does not feel right.  She denies any rectal bleeding, however when she has a bowel movement, initially it is formed then runny  Follow-up visit date 223 Patient is here for follow-up of iron deficiency anemia.  She underwent upper endoscopy and colonoscopy which were unremarkable.  Video capsule endoscopy was also normal.  She received IV iron through hematology and reports feeling significantly better with regards to her energy levels.  Her bowel movements are still irregular, every other day, she reports feeling pressure and discomfort in  the rectum which she thinks prevents from using bathroom.  NSAIDs: None  Antiplts/Anticoagulants/Anti thrombotics: None  GI Procedures:  EGD and colonoscopy 10/27/2021 - Normal duodenal bulb and examined duodenum. - Erosive gastropathy with no bleeding and no stigmata of recent bleeding. Biopsied. - Normal gastric body and incisura. Biopsied. - Esophagogastric landmarks identified. - Normal gastroesophageal junction and esophagus.  - Preparation of the colon was fair. - The examined portion of the ileum was normal. - Three 3 to 4 mm polyps at the recto-sigmoid colon, removed with a cold snare. Resected and retrieved. - Non-bleeding external hemorrhoids.  DIAGNOSIS:  A.  STOMACH, RANDOM; COLD BIOPSY:  - ANTRAL MUCOSA WITH MILD REACTIVE GASTRITIS.  - NEGATIVE FOR H. PYLORI, INTESTINAL METAPLASIA, DYSPLASIA AND  MALIGNANCY.   B.  COLON POLYP X3, RECTOSIGMOID; COLD SNARE:  - HYPERPLASTIC POLYP (2).  - ONLY TWO BIOPSY FRAGMENTS ARE IDENTIFIED ON GROSS AND MICROSCOPIC  EXAMINATION.  - NEGATIVE FOR DYSPLASIA AND MALIGNANCY.   Video capsule endoscopy 12/09/2021 Normal study  She denies any family history of GI malignancy  Past Medical History:  Diagnosis Date   Allergy    Anxiety    B12 deficiency    IDA (iron deficiency anemia)     Past Surgical History:  Procedure Laterality Date   COLONOSCOPY WITH PROPOFOL N/A 10/27/2021   Procedure: COLONOSCOPY WITH PROPOFOL;  Surgeon: Toney Reil, MD;  Location: [facility] ENDOSCOPY;  Service: Gastroenterology;  Laterality: N/A;   ELBOW SURGERY  ESOPHAGOGASTRODUODENOSCOPY N/A 10/27/2021   Procedure: ESOPHAGOGASTRODUODENOSCOPY (EGD);  Surgeon: Toney Reil, MD;  Location: [facility] ENDOSCOPY;  Service: Gastroenterology;  Laterality: N/A;   GIVENS CAPSULE STUDY N/A 11/17/2021   Procedure: GIVENS CAPSULE STUDY;  Surgeon: Toney Reil, MD;  Location: [facility] ENDOSCOPY;  Service: Gastroenterology;  Laterality: N/A;   TUBAL  LIGATION Bilateral 01/1993    Current Outpatient Medications:    hydrocortisone (ANUSOL-HC) 2.5 % rectal cream, Place 1 Application rectally 2 (two) times daily., Disp: 30 g, Rfl: 0   meloxicam (MOBIC) 15 MG tablet, Take 15 mg by mouth daily as needed., Disp: , Rfl:    vitamin B-12 (CYANOCOBALAMIN) 1000 MCG tablet, Take 1 tablet (1,000 mcg total) by mouth daily., Disp: 30 tablet, Rfl: 3  Family History  Problem Relation Age of Onset   Hypertension Mother    Diabetes Mother    COPD Father    Congestive Heart Failure Father    Stroke Maternal Grandmother    Diabetes Maternal Grandmother    Hypertension Maternal Grandfather    COPD Paternal Grandfather    Congestive Heart Failure Paternal Grandfather      Social History   Tobacco Use   Smoking status: Every Day    Packs/day: 0.50    Years: 27.00    Total pack years: 13.50    Types: Cigarettes   Smokeless tobacco: Never  Vaping Use   Vaping Use: Never used  Substance Use Topics   Alcohol use: Yes    Comment: socially   Drug use: Not Currently    Types: Marijuana    Allergies as of 01/13/2022 - Review Complete 01/13/2022  Allergen Reaction Noted   Amoxicillin  05/11/2019    Review of Systems:    All systems reviewed and negative except where noted in HPI.   Physical Exam:  BP (!) 143/83 (BP Location: Left Arm, Patient Position: Sitting, Cuff Size: Normal)   Pulse 73   Temp 98.4 F (36.9 C) (Oral)   Ht 5\' 4"  (1.626 m)   Wt 174 lb (78.9 kg)   BMI 29.87 kg/m  No LMP recorded.  General:   Alert,  Well-developed, well-nourished, pleasant and cooperative in NAD Head:  Normocephalic and atraumatic. Eyes:  Sclera clear, no icterus.   Conjunctiva pink. Ears:  Normal auditory acuity. Nose:  No deformity, discharge, or lesions. Mouth:  No deformity or lesions,oropharynx pink & moist. Neck:  Supple; no masses or thyromegaly. Lungs:  Respirations even and unlabored.  Clear throughout to auscultation.   No wheezes,  crackles, or rhonchi. No acute distress. Heart:  Regular rate and rhythm; no murmurs, clicks, rubs, or gallops. Abdomen:  Normal bowel sounds. Soft, non-tender and non-distended without masses, hepatosplenomegaly or hernias noted.  No guarding or rebound tenderness.   Rectal: Not performed Msk:  Symmetrical without gross deformities. Good, equal movement & strength bilaterally. Pulses:  Normal pulses noted. Extremities:  No clubbing or edema.  No cyanosis. Neurologic:  Alert and oriented x3;  grossly normal neurologically. Skin:  Intact without significant lesions or rashes. No jaundice. Psych:  Alert and cooperative. Normal mood and affect.  Imaging Studies: Reviewed  Assessment and Plan:   Avyn Aden is a 51 y.o. pleasant African-American female with new onset of constipation that started after her oral surgery, iron deficiency anemia  Iron deficiency anemia: Patient is responding to parenteral iron therapy, symptoms are improving EGD and colonoscopy with video capsule endoscopy were all unremarkable  Chronic constipation Recommend to start MiraLAX daily Continue high-fiber  diet Discussed about adequate intake of water  Symptomatic external hemorrhoids, Grade I Discussed about trial of Anusol cream Discussed regarding outpatient hemorrhoid ligation if her symptoms are persistent and patient will let me know   Follow up as needed   Cephas Darby, MD

## 2022-01-20 DIAGNOSIS — M9905 Segmental and somatic dysfunction of pelvic region: Secondary | ICD-10-CM | POA: Diagnosis not present

## 2022-01-20 DIAGNOSIS — M5431 Sciatica, right side: Secondary | ICD-10-CM | POA: Diagnosis not present

## 2022-01-20 DIAGNOSIS — M9903 Segmental and somatic dysfunction of lumbar region: Secondary | ICD-10-CM | POA: Diagnosis not present

## 2022-01-20 DIAGNOSIS — M5432 Sciatica, left side: Secondary | ICD-10-CM | POA: Diagnosis not present

## 2022-02-03 DIAGNOSIS — M5432 Sciatica, left side: Secondary | ICD-10-CM | POA: Diagnosis not present

## 2022-02-03 DIAGNOSIS — M9905 Segmental and somatic dysfunction of pelvic region: Secondary | ICD-10-CM | POA: Diagnosis not present

## 2022-02-03 DIAGNOSIS — M9903 Segmental and somatic dysfunction of lumbar region: Secondary | ICD-10-CM | POA: Diagnosis not present

## 2022-02-03 DIAGNOSIS — M5431 Sciatica, right side: Secondary | ICD-10-CM | POA: Diagnosis not present

## 2022-03-01 ENCOUNTER — Inpatient Hospital Stay: Payer: BC Managed Care – PPO | Attending: Oncology

## 2022-03-01 DIAGNOSIS — F17210 Nicotine dependence, cigarettes, uncomplicated: Secondary | ICD-10-CM | POA: Diagnosis not present

## 2022-03-01 DIAGNOSIS — Z880 Allergy status to penicillin: Secondary | ICD-10-CM | POA: Insufficient documentation

## 2022-03-01 DIAGNOSIS — Z8249 Family history of ischemic heart disease and other diseases of the circulatory system: Secondary | ICD-10-CM | POA: Diagnosis not present

## 2022-03-01 DIAGNOSIS — N920 Excessive and frequent menstruation with regular cycle: Secondary | ICD-10-CM | POA: Insufficient documentation

## 2022-03-01 DIAGNOSIS — Z79899 Other long term (current) drug therapy: Secondary | ICD-10-CM | POA: Insufficient documentation

## 2022-03-01 DIAGNOSIS — Z823 Family history of stroke: Secondary | ICD-10-CM | POA: Insufficient documentation

## 2022-03-01 DIAGNOSIS — D500 Iron deficiency anemia secondary to blood loss (chronic): Secondary | ICD-10-CM | POA: Insufficient documentation

## 2022-03-01 DIAGNOSIS — R5383 Other fatigue: Secondary | ICD-10-CM | POA: Insufficient documentation

## 2022-03-01 DIAGNOSIS — K644 Residual hemorrhoidal skin tags: Secondary | ICD-10-CM | POA: Insufficient documentation

## 2022-03-01 DIAGNOSIS — Z836 Family history of other diseases of the respiratory system: Secondary | ICD-10-CM | POA: Diagnosis not present

## 2022-03-01 DIAGNOSIS — Z833 Family history of diabetes mellitus: Secondary | ICD-10-CM | POA: Diagnosis not present

## 2022-03-01 LAB — IRON AND TIBC
Iron: 50 ug/dL (ref 28–170)
Saturation Ratios: 18 % (ref 10.4–31.8)
TIBC: 286 ug/dL (ref 250–450)
UIBC: 236 ug/dL

## 2022-03-01 LAB — CBC WITH DIFFERENTIAL/PLATELET
Abs Immature Granulocytes: 0.02 10*3/uL (ref 0.00–0.07)
Basophils Absolute: 0 10*3/uL (ref 0.0–0.1)
Basophils Relative: 0 %
Eosinophils Absolute: 0.1 10*3/uL (ref 0.0–0.5)
Eosinophils Relative: 1 %
HCT: 37.8 % (ref 36.0–46.0)
Hemoglobin: 12 g/dL (ref 12.0–15.0)
Immature Granulocytes: 0 %
Lymphocytes Relative: 41 %
Lymphs Abs: 3 10*3/uL (ref 0.7–4.0)
MCH: 28.8 pg (ref 26.0–34.0)
MCHC: 31.7 g/dL (ref 30.0–36.0)
MCV: 90.6 fL (ref 80.0–100.0)
Monocytes Absolute: 0.4 10*3/uL (ref 0.1–1.0)
Monocytes Relative: 6 %
Neutro Abs: 3.8 10*3/uL (ref 1.7–7.7)
Neutrophils Relative %: 52 %
Platelets: 189 10*3/uL (ref 150–400)
RBC: 4.17 MIL/uL (ref 3.87–5.11)
RDW: 14.5 % (ref 11.5–15.5)
WBC: 7.3 10*3/uL (ref 4.0–10.5)
nRBC: 0 % (ref 0.0–0.2)

## 2022-03-01 LAB — FERRITIN: Ferritin: 41 ng/mL (ref 11–307)

## 2022-03-01 MED FILL — Iron Sucrose Inj 20 MG/ML (Fe Equiv): INTRAVENOUS | Qty: 10 | Status: AC

## 2022-03-02 ENCOUNTER — Inpatient Hospital Stay: Payer: BC Managed Care – PPO

## 2022-03-02 ENCOUNTER — Encounter: Payer: Self-pay | Admitting: Oncology

## 2022-03-02 ENCOUNTER — Inpatient Hospital Stay (HOSPITAL_BASED_OUTPATIENT_CLINIC_OR_DEPARTMENT_OTHER): Payer: BC Managed Care – PPO | Admitting: Oncology

## 2022-03-02 VITALS — BP 144/89 | HR 69 | Temp 97.1°F | Resp 18 | Wt 176.6 lb

## 2022-03-02 DIAGNOSIS — D500 Iron deficiency anemia secondary to blood loss (chronic): Secondary | ICD-10-CM

## 2022-03-02 NOTE — Assessment & Plan Note (Signed)
Labs are reviewed and discussed with patient. Improved hemoglobin and iron store.  Hold off IV venofer.

## 2022-03-02 NOTE — Progress Notes (Signed)
Hematology/Oncology Progress note [phone] [phone]         Patient Care Team: Jacky Kindle, FNP as PCP - General (Family Medicine)   REFERRING PROVIDER: Jacky Kindle, FNP  CHIEF COMPLAINTS/REASON FOR VISIT:  Anemia  ASSESSMENT & PLAN:  Iron deficiency anemia due to chronic blood loss Labs are reviewed and discussed with patient. Improved hemoglobin and iron store.  Hold off IV venofer.  Orders Placed This Encounter  Procedures   CBC with Differential/Platelet    Standing Status:   Future    Standing Expiration Date:   03/03/2023   Ferritin    Standing Status:   Future    Standing Expiration Date:   03/03/2023   Iron and TIBC    Standing Status:   Future    Standing Expiration Date:   03/03/2023   Follow up 6 months All questions were answered. The patient knows to call the clinic with any problems, questions or concerns.  Rickard Patience, MD, PhD [facility] Hematology Oncology 03/02/2022     HISTORY OF PRESENTING ILLNESS:  Hannah [place] is a  51 y.o.  female with PMH listed below who was referred to me for anemia Reviewed patient's recent labs that was done.  10/08/21 CBC showed hemoglobin of 9.4, mcv 79, ferritin 11, iron saturation 7.   She denies recent chest pain on exertion, shortness of breath on minimal exertion, pre-syncopal episodes, or palpitations She had not noticed any recent bleeding such as epistaxis, hematuria or hematochezia.  She denies over the counter NSAID ingestion.  Her last colonoscopy was 10/27/21, -Preparation of the colon was fair.- The examined portion of the ileum was normal. - Three 3 to 4 mm polyps at the recto-sigmoid colon, removed with a cold snare. Resected and retrieved. - Non-bleeding external hemorrhoids.  -pathology showed hyperplastic polyps.   EGD on Erosive gastropathy with no bleeding and no stigmata of recent bleeding. Biopsied.- Normal gastric body and incisura. Biopsied.-  Esophagogastric landmarks identified.- Normal gastroesophageal junction and esophagus. Pathology showed antral mucosa with mild reactive gastritis. Negative for H Pylori, intestinal metaplasia, dysplasia and malignancy.   She denies any pica and eats a variety of diet. The patient was prescribed oral iron supplements and she takes Fusion plus.   INTERVAL HISTORY Hannah [place] is a 51 y.o. female who has above history reviewed by me today presents for follow up visit for  Iron deficiency anemia.  Some fatigue, improved. Tolerated IV Venofer previously. Menstrual period has stopped.   MEDICAL HISTORY:  Past Medical History:  Diagnosis Date   Allergy    Anxiety    B12 deficiency    IDA (iron deficiency anemia)     SURGICAL HISTORY: Past Surgical History:  Procedure Laterality Date   COLONOSCOPY WITH PROPOFOL N/A 10/27/2021   Procedure: COLONOSCOPY WITH PROPOFOL;  Surgeon: Toney Reil, MD;  Location: [facility] ENDOSCOPY;  Service: Gastroenterology;  Laterality: N/A;   ELBOW SURGERY     ESOPHAGOGASTRODUODENOSCOPY N/A 10/27/2021   Procedure: ESOPHAGOGASTRODUODENOSCOPY (EGD);  Surgeon: Toney Reil, MD;  Location: [facility] ENDOSCOPY;  Service: Gastroenterology;  Laterality: N/A;   GIVENS CAPSULE STUDY N/A 11/17/2021   Procedure: GIVENS CAPSULE STUDY;  Surgeon: Toney Reil, MD;  Location: [facility] ENDOSCOPY;  Service: Gastroenterology;  Laterality: N/A;   TUBAL LIGATION Bilateral 01/1993    SOCIAL HISTORY: Social History   Socioeconomic History   Marital status: Single    Spouse name: Not on file   Number of children: Not on  file   Years of education: Not on file   Highest education level: Not on file  Occupational History   Not on file  Tobacco Use   Smoking status: Every Day    Packs/day: 0.50    Years: 27.00    Total pack years: 13.50    Types: Cigarettes   Smokeless tobacco: Never  Vaping Use   Vaping Use: Never used  Substance and Sexual Activity    Alcohol use: Yes    Comment: socially   Drug use: Not Currently    Types: Marijuana   Sexual activity: Not on file  Other Topics Concern   Not on file  Social History Narrative   Not on file   Social Determinants of Health   Financial Resource Strain: Not on file  Food Insecurity: Not on file  Transportation Needs: Not on file  Physical Activity: Not on file  Stress: Not on file  Social Connections: Not on file  Intimate Partner Violence: Not on file    FAMILY HISTORY: Family History  Problem Relation Age of Onset   Hypertension Mother    Diabetes Mother    COPD Father    Congestive Heart Failure Father    Stroke Maternal Grandmother    Diabetes Maternal Grandmother    Hypertension Maternal Grandfather    COPD Paternal Grandfather    Congestive Heart Failure Paternal Grandfather     ALLERGIES:  is allergic to amoxicillin.  MEDICATIONS:  Current Outpatient Medications  Medication Sig Dispense Refill   vitamin B-12 (CYANOCOBALAMIN) 1000 MCG tablet Take 1 tablet (1,000 mcg total) by mouth daily. 30 tablet 3   No current facility-administered medications for this visit.    Review of Systems  Constitutional:  Positive for fatigue. Negative for appetite change, chills and fever.  HENT:   Negative for hearing loss and voice change.   Eyes:  Negative for eye problems.  Respiratory:  Negative for chest tightness and cough.   Cardiovascular:  Negative for chest pain.  Gastrointestinal:  Negative for abdominal distention, abdominal pain and blood in stool.  Endocrine: Negative for hot flashes.  Genitourinary:  Positive for menstrual problem. Negative for difficulty urinating and frequency.   Musculoskeletal:  Negative for arthralgias.  Skin:  Negative for itching and rash.  Neurological:  Negative for extremity weakness.  Hematological:  Negative for adenopathy.  Psychiatric/Behavioral:  Negative for confusion.     PHYSICAL EXAMINATION:  Vitals:   03/02/22 1305   BP: (!) 144/89  Pulse: 69  Resp: 18  Temp: (!) 97.1 F (36.2 C)   Filed Weights   03/02/22 1305  Weight: 176 lb 9.6 oz (80.1 kg)    Physical Exam Constitutional:      General: She is not in acute distress. HENT:     Head: Normocephalic and atraumatic.  Eyes:     General: No scleral icterus. Cardiovascular:     Rate and Rhythm: Normal rate and regular rhythm.     Heart sounds: Normal heart sounds.  Pulmonary:     Effort: Pulmonary effort is normal. No respiratory distress.     Breath sounds: No wheezing.  Abdominal:     General: Bowel sounds are normal. There is no distension.     Palpations: Abdomen is soft.  Musculoskeletal:        General: No deformity. Normal range of motion.     Cervical back: Normal range of motion and neck supple.  Skin:    General: Skin is warm and dry.  Findings: No erythema or rash.  Neurological:     Mental Status: She is alert and oriented to person, place, and time. Mental status is at baseline.     Cranial Nerves: No cranial nerve deficit.     Coordination: Coordination normal.  Psychiatric:        Mood and Affect: Mood normal.      LABORATORY DATA:  I have reviewed the data as listed    Latest Ref Rng & Units 03/01/2022   12:54 PM 11/27/2021   10:09 AM 10/08/2021    2:14 PM  CBC  WBC 4.0 - 10.5 K/uL 7.3  7.2  9.0   Hemoglobin 12.0 - 15.0 g/dL 12.0  10.8  9.4   Hematocrit 36.0 - 46.0 % 37.8  35.7  31.0   Platelets 150 - 400 K/uL 189  267  346       Latest Ref Rng & Units 11/27/2021   10:09 AM 08/31/2021    9:09 AM 10/16/2009   11:01 AM  CMP  Glucose 70 - 99 mg/dL 80  95  90   BUN 6 - 20 mg/dL 5  7  5    Creatinine 0.44 - 1.00 mg/dL 0.60  0.64  0.65   Sodium 135 - 145 mmol/L 137  142  138   Potassium 3.5 - 5.1 mmol/L 3.7  4.1  4.1   Chloride 98 - 111 mmol/L 102  101  106   CO2 22 - 32 mmol/L 28  24  26    Calcium 8.9 - 10.3 mg/dL 8.4  9.4  9.0   Total Protein 6.5 - 8.1 g/dL 6.8  7.5    Total Bilirubin 0.3 - 1.2 mg/dL 0.4   0.2    Alkaline Phos 38 - 126 U/L 82  108    AST 15 - 41 U/L 18  26    ALT 0 - 44 U/L 10  14     Lab Results  Component Value Date   IRON 50 03/01/2022   TIBC 286 03/01/2022   FERRITIN 41 03/01/2022        RADIOGRAPHIC STUDIES: I have personally reviewed the radiological images as listed and agreed with the findings in the report. No results found.

## 2022-03-02 NOTE — Progress Notes (Signed)
Pt here for follow-up. Pt reports feeling tired.  

## 2022-04-15 ENCOUNTER — Encounter: Payer: Self-pay | Admitting: Oncology

## 2022-06-03 ENCOUNTER — Ambulatory Visit (INDEPENDENT_AMBULATORY_CARE_PROVIDER_SITE_OTHER): Payer: BC Managed Care – PPO | Admitting: Gastroenterology

## 2022-06-03 ENCOUNTER — Encounter: Payer: Self-pay | Admitting: Gastroenterology

## 2022-06-03 VITALS — BP 150/95 | HR 80 | Temp 97.6°F | Ht 64.0 in | Wt 173.2 lb

## 2022-06-03 DIAGNOSIS — K640 First degree hemorrhoids: Secondary | ICD-10-CM | POA: Diagnosis not present

## 2022-06-03 DIAGNOSIS — K5909 Other constipation: Secondary | ICD-10-CM

## 2022-06-03 NOTE — Progress Notes (Signed)

## 2022-06-03 NOTE — Progress Notes (Signed)
Hannah Repress, MD [address]  [address]  [place], [place] [address]  Main: [phone]  Fax: [phone]    Gastroenterology Consultation  Referring Provider:     Jacky Kindle, FNP Primary Care Physician:  Jacky Kindle, FNP Primary Gastroenterologist:  Dr. Arlyss Gallagher Reason for Consultation: Iron deficiency anemia, chronic constipation, symptomatic hemorrhoids        HPI:   Hannah Gallagher is a 51 y.o. female referred by Dr. Jacky Kindle, FNP  for consultation & management of new onset of constipation which has started since she had oral surgery in February.  After the surgery, she was not able to eat regular foods, was managing on soft diet only.  She developed severe constipation and was taking liquid Dulcolax.  She developed severe low back pain that prompted her to go to ER on 08/31/2021.  Labs revealed microcytic anemia, hemoglobin 10, MCV 77, CMP normal, UA and urine culture were negative.  Abdominal x-ray did not reveal any significant fecal material.  Patient is therefore referred to GI for further evaluation.  Patient reports that since then she is trying to eat more fiber, cut back on all the sodas.  She does admit to drinking sweet tea and ginger ale along with water.  She does not smoke or drink alcohol.  She works from home and she has lost some weight.  She does reports feeling bloated and feels something down in the rectum area and does not feel right.  She denies any rectal bleeding, however when she has a bowel movement, initially it is formed then runny  Follow-up visit 01/13/22 Patient is here for follow-up of iron deficiency anemia.  She underwent upper endoscopy and colonoscopy which were unremarkable.  Video capsule endoscopy was also normal.  She received IV iron through hematology and reports feeling significantly better with regards to her energy levels.  Her bowel movements are still irregular, every other day, she reports feeling pressure and  discomfort in the rectum which she thinks prevents from using bathroom.  Follow-up visit 06/03/2022 Hannah Gallagher is here to discuss about hemorrhoid ligation.  She reports ongoing hemorrhoidal symptoms including rectal pressure, pain, discomfort, itching and burning.  She continues to have bowel movements.  Taking Metamucil Gummies.  Had iron deficiency anemia has resolved.  She is no longer taking iron supplements because of worsening constipation.  She reports significant improvement in energy levels.  NSAIDs: None  Antiplts/Anticoagulants/Anti thrombotics: None  GI Procedures:  EGD and colonoscopy 10/27/2021 - Normal duodenal bulb and examined duodenum. - Erosive gastropathy with no bleeding and no stigmata of recent bleeding. Biopsied. - Normal gastric body and incisura. Biopsied. - Esophagogastric landmarks identified. - Normal gastroesophageal junction and esophagus.  - Preparation of the colon was fair. - The examined portion of the ileum was normal. - Three 3 to 4 mm polyps at the recto-sigmoid colon, removed with a cold snare. Resected and retrieved. - Non-bleeding external hemorrhoids.  DIAGNOSIS:  A.  STOMACH, RANDOM; COLD BIOPSY:  - ANTRAL MUCOSA WITH MILD REACTIVE GASTRITIS.  - NEGATIVE FOR H. PYLORI, INTESTINAL METAPLASIA, DYSPLASIA AND  MALIGNANCY.   B.  COLON POLYP X3, RECTOSIGMOID; COLD SNARE:  - HYPERPLASTIC POLYP (2).  - ONLY TWO BIOPSY FRAGMENTS ARE IDENTIFIED ON GROSS AND MICROSCOPIC  EXAMINATION.  - NEGATIVE FOR DYSPLASIA AND MALIGNANCY.   Video capsule endoscopy 12/09/2021 Normal study  She denies any family history of GI malignancy  Past Medical History:  Diagnosis  Date   Allergy    Anxiety    B12 deficiency    IDA (iron deficiency anemia)     Past Surgical History:  Procedure Laterality Date   COLONOSCOPY WITH PROPOFOL N/A 10/27/2021   Procedure: COLONOSCOPY WITH PROPOFOL;  Surgeon: Toney Reil, MD;  Location: [facility] ENDOSCOPY;  Service:  Gastroenterology;  Laterality: N/A;   ELBOW SURGERY     ESOPHAGOGASTRODUODENOSCOPY N/A 10/27/2021   Procedure: ESOPHAGOGASTRODUODENOSCOPY (EGD);  Surgeon: Toney Reil, MD;  Location: [facility] ENDOSCOPY;  Service: Gastroenterology;  Laterality: N/A;   GIVENS CAPSULE STUDY N/A 11/17/2021   Procedure: GIVENS CAPSULE STUDY;  Surgeon: Toney Reil, MD;  Location: [facility] ENDOSCOPY;  Service: Gastroenterology;  Laterality: N/A;   TUBAL LIGATION Bilateral 01/1993    Current Outpatient Medications:    vitamin B-12 (CYANOCOBALAMIN) 1000 MCG tablet, Take 1 tablet (1,000 mcg total) by mouth daily., Disp: 30 tablet, Rfl: 3  Family History  Problem Relation Age of Onset   Hypertension Mother    Diabetes Mother    COPD Father    Congestive Heart Failure Father    Stroke Maternal Grandmother    Diabetes Maternal Grandmother    Hypertension Maternal Grandfather    COPD Paternal Grandfather    Congestive Heart Failure Paternal Grandfather      Social History   Tobacco Use   Smoking status: Every Day    Packs/day: 0.50    Years: 27.00    Total pack years: 13.50    Types: Cigarettes   Smokeless tobacco: Never  Vaping Use   Vaping Use: Never used  Substance Use Topics   Alcohol use: Yes    Comment: socially   Drug use: Not Currently    Types: Marijuana    Allergies as of 06/03/2022 - Review Complete 06/03/2022  Allergen Reaction Noted   Amoxicillin  05/11/2019    Review of Systems:    All systems reviewed and negative except where noted in HPI.   Physical Exam:  BP (!) 150/95 (BP Location: Left Arm, Patient Position: Sitting, Cuff Size: Normal)   Pulse 80   Temp 97.6 F (36.4 C) (Oral)   Ht 5\' 4"  (1.626 m)   Wt 173 lb 4 oz (78.6 kg)   BMI 29.74 kg/m  No LMP recorded.  General:   Alert,  Well-developed, well-nourished, pleasant and cooperative in NAD Head:  Normocephalic and atraumatic. Eyes:  Sclera clear, no icterus.   Conjunctiva pink. Ears:  Normal auditory  acuity. Nose:  No deformity, discharge, or lesions. Mouth:  No deformity or lesions,oropharynx pink & moist. Neck:  Supple; no masses or thyromegaly. Lungs:  Respirations even and unlabored.  Clear throughout to auscultation.   No wheezes, crackles, or rhonchi. No acute distress. Heart:  Regular rate and rhythm; no murmurs, clicks, rubs, or gallops. Abdomen:  Normal bowel sounds. Soft, non-tender and non-distended without masses, hepatosplenomegaly or hernias noted.  No guarding or rebound tenderness.   Rectal: Not performed Msk:  Symmetrical without gross deformities. Good, equal movement & strength bilaterally. Pulses:  Normal pulses noted. Extremities:  No clubbing or edema.  No cyanosis. Neurologic:  Alert and oriented x3;  grossly normal neurologically. Skin:  Intact without significant lesions or rashes. No jaundice. Psych:  Alert and cooperative. Normal mood and affect.  Imaging Studies: Reviewed  Assessment and Plan:   Hannah Gallagher is a 51 y.o. pleasant African-American female with grade 1 symptomatic hemorrhoids, chronic constipation, and iron deficiency anemia  Iron deficiency anemia: Currently resolved with  iron replacement therapy EGD and colonoscopy with video capsule endoscopy were all unremarkable Do not recommend any further workup at this time  Chronic constipation Recommend to start MiraLAX 34 g daily Continue Metamucil fiber with large cup of water daily Continue high-fiber diet Discussed about adequate intake of water  Symptomatic external hemorrhoids, Grade I Discussed about hemorrhoid ligation, procedure, recent benefits, consent obtained Proceed with hemorrhoid ligation today   Follow up in 2 weeks   Hannah Repress, MD

## 2022-06-10 ENCOUNTER — Encounter: Payer: Self-pay | Admitting: Oncology

## 2022-06-22 ENCOUNTER — Ambulatory Visit: Payer: BC Managed Care – PPO | Admitting: Gastroenterology

## 2022-08-31 ENCOUNTER — Inpatient Hospital Stay: Payer: BC Managed Care – PPO

## 2022-09-01 ENCOUNTER — Inpatient Hospital Stay: Payer: BC Managed Care – PPO | Attending: Oncology

## 2022-09-01 DIAGNOSIS — Z825 Family history of asthma and other chronic lower respiratory diseases: Secondary | ICD-10-CM | POA: Insufficient documentation

## 2022-09-01 DIAGNOSIS — Z823 Family history of stroke: Secondary | ICD-10-CM | POA: Insufficient documentation

## 2022-09-01 DIAGNOSIS — D509 Iron deficiency anemia, unspecified: Secondary | ICD-10-CM | POA: Insufficient documentation

## 2022-09-01 DIAGNOSIS — Z79899 Other long term (current) drug therapy: Secondary | ICD-10-CM | POA: Insufficient documentation

## 2022-09-01 DIAGNOSIS — Z8249 Family history of ischemic heart disease and other diseases of the circulatory system: Secondary | ICD-10-CM | POA: Diagnosis not present

## 2022-09-01 DIAGNOSIS — Z9851 Tubal ligation status: Secondary | ICD-10-CM | POA: Diagnosis not present

## 2022-09-01 DIAGNOSIS — Z833 Family history of diabetes mellitus: Secondary | ICD-10-CM | POA: Diagnosis not present

## 2022-09-01 DIAGNOSIS — D500 Iron deficiency anemia secondary to blood loss (chronic): Secondary | ICD-10-CM

## 2022-09-01 DIAGNOSIS — Z880 Allergy status to penicillin: Secondary | ICD-10-CM | POA: Diagnosis not present

## 2022-09-01 DIAGNOSIS — F17210 Nicotine dependence, cigarettes, uncomplicated: Secondary | ICD-10-CM | POA: Diagnosis not present

## 2022-09-01 LAB — CBC WITH DIFFERENTIAL/PLATELET
Abs Immature Granulocytes: 0.02 10*3/uL (ref 0.00–0.07)
Basophils Absolute: 0 10*3/uL (ref 0.0–0.1)
Basophils Relative: 0 %
Eosinophils Absolute: 0.1 10*3/uL (ref 0.0–0.5)
Eosinophils Relative: 2 %
HCT: 38.8 % (ref 36.0–46.0)
Hemoglobin: 12.4 g/dL (ref 12.0–15.0)
Immature Granulocytes: 0 %
Lymphocytes Relative: 36 %
Lymphs Abs: 2.3 10*3/uL (ref 0.7–4.0)
MCH: 28.6 pg (ref 26.0–34.0)
MCHC: 32 g/dL (ref 30.0–36.0)
MCV: 89.6 fL (ref 80.0–100.0)
Monocytes Absolute: 0.4 10*3/uL (ref 0.1–1.0)
Monocytes Relative: 6 %
Neutro Abs: 3.6 10*3/uL (ref 1.7–7.7)
Neutrophils Relative %: 56 %
Platelets: 200 10*3/uL (ref 150–400)
RBC: 4.33 MIL/uL (ref 3.87–5.11)
RDW: 14.2 % (ref 11.5–15.5)
WBC: 6.5 10*3/uL (ref 4.0–10.5)
nRBC: 0 % (ref 0.0–0.2)

## 2022-09-01 LAB — IRON AND TIBC
Iron: 71 ug/dL (ref 28–170)
Saturation Ratios: 23 % (ref 10.4–31.8)
TIBC: 307 ug/dL (ref 250–450)
UIBC: 236 ug/dL

## 2022-09-01 LAB — FERRITIN: Ferritin: 29 ng/mL (ref 11–307)

## 2022-09-03 ENCOUNTER — Inpatient Hospital Stay (HOSPITAL_BASED_OUTPATIENT_CLINIC_OR_DEPARTMENT_OTHER): Payer: BC Managed Care – PPO | Admitting: Oncology

## 2022-09-03 ENCOUNTER — Encounter: Payer: Self-pay | Admitting: Oncology

## 2022-09-03 DIAGNOSIS — D500 Iron deficiency anemia secondary to blood loss (chronic): Secondary | ICD-10-CM

## 2022-09-03 DIAGNOSIS — D509 Iron deficiency anemia, unspecified: Secondary | ICD-10-CM | POA: Diagnosis not present

## 2022-09-03 MED FILL — Iron Sucrose Inj 20 MG/ML (Fe Equiv): INTRAVENOUS | Qty: 10 | Status: AC

## 2022-09-03 NOTE — Assessment & Plan Note (Signed)
Labs are reviewed and discussed with patient. Improved hemoglobin and iron store.  Given that her lab is stable, I recommend no IV Venofer treatments.

## 2022-09-03 NOTE — Progress Notes (Signed)
HEMATOLOGY-ONCOLOGY TeleHEALTH VISIT PROGRESS NOTE  I connected with Hannah Gallagher on 09/03/22  at 12:15 PM EDT by video enabled telemedicine visit and verified that I am speaking with the correct person using two identifiers. I discussed the limitations, risks, security and privacy concerns of performing an evaluation and management service by telemedicine and the availability of in-person appointments. The patient expressed understanding and agreed to proceed.   Other persons participating in the visit and their role in the encounter:  None  Patient's location: Home  Provider's location: office Chief Complaint: Iron deficiency anemia   INTERVAL HISTORY Hannah Gallagher is a 52 y.o. female who has above history reviewed by me today presents for follow up visit for management of iron deficiency anemia Patient denies any additional rectal hemorrhoid bleeding events.  She feels well.  No new complaints.  Review of Systems  Constitutional:  Negative for appetite change, chills, fatigue and fever.  HENT:   Negative for hearing loss and voice change.   Eyes:  Negative for eye problems.  Respiratory:  Negative for chest tightness and cough.   Cardiovascular:  Negative for chest pain.  Gastrointestinal:  Negative for abdominal distention, abdominal pain and blood in stool.  Endocrine: Negative for hot flashes.  Genitourinary:  Negative for difficulty urinating and frequency.   Musculoskeletal:  Negative for arthralgias.  Skin:  Negative for itching and rash.  Neurological:  Negative for extremity weakness.  Hematological:  Negative for adenopathy.  Psychiatric/Behavioral:  Negative for confusion.     Past Medical History:  Diagnosis Date   Allergy    Anxiety    B12 deficiency    IDA (iron deficiency anemia)    Past Surgical History:  Procedure Laterality Date   COLONOSCOPY WITH PROPOFOL N/A 10/27/2021   Procedure: COLONOSCOPY WITH PROPOFOL;  Surgeon: Lin Landsman, MD;  Location: [facility] ENDOSCOPY;  Service: Gastroenterology;  Laterality: N/A;   ELBOW SURGERY     ESOPHAGOGASTRODUODENOSCOPY N/A 10/27/2021   Procedure: ESOPHAGOGASTRODUODENOSCOPY (EGD);  Surgeon: Lin Landsman, MD;  Location: [facility] ENDOSCOPY;  Service: Gastroenterology;  Laterality: N/A;   GIVENS CAPSULE STUDY N/A 11/17/2021   Procedure: GIVENS CAPSULE STUDY;  Surgeon: Lin Landsman, MD;  Location: [facility] ENDOSCOPY;  Service: Gastroenterology;  Laterality: N/A;   TUBAL LIGATION Bilateral 01/1993    Family History  Problem Relation Age of Onset   Hypertension Mother    Diabetes Mother    COPD Father    Congestive Heart Failure Father    Stroke Maternal Grandmother    Diabetes Maternal Grandmother    Hypertension Maternal Grandfather    COPD Paternal Grandfather    Congestive Heart Failure Paternal Grandfather     Social History   Socioeconomic History   Marital status: Single    Spouse name: Not on file   Number of children: Not on file   Years of education: Not on file   Highest education level: Not on file  Occupational History   Not on file  Tobacco Use   Smoking status: Every Day    Packs/day: 0.50    Years: 27.00    Additional pack years: 0.00    Total pack years: 13.50    Types: Cigarettes   Smokeless tobacco: Never  Vaping Use   Vaping Use: Never used  Substance and Sexual Activity   Alcohol use: Yes    Comment: socially   Drug use: Not Currently    Types: Marijuana   Sexual activity: Not on file  Other  Topics Concern   Not on file  Social History Narrative   Not on file   Social Determinants of Health   Financial Resource Strain: Not on file  Food Insecurity: Not on file  Transportation Needs: Not on file  Physical Activity: Not on file  Stress: Not on file  Social Connections: Not on file  Intimate Partner Violence: Not on file    Current Outpatient Medications on File Prior to Visit  Medication Sig Dispense Refill   vitamin B-12  (CYANOCOBALAMIN) 1000 MCG tablet Take 1 tablet (1,000 mcg total) by mouth daily. 30 tablet 3   No current facility-administered medications on file prior to visit.    Allergies  Allergen Reactions   Amoxicillin        Observations/Objective: There were no vitals filed for this visit. There is no height or weight on file to calculate BMI.  Physical Exam  CBC    Component Value Date/Time   WBC 6.5 09/01/2022 0839   RBC 4.33 09/01/2022 0839   HGB 12.4 09/01/2022 0839   HGB 9.4 (L) 10/08/2021 1414   HCT 38.8 09/01/2022 0839   HCT 31.0 (L) 10/08/2021 1414   PLT 200 09/01/2022 0839   PLT 346 10/08/2021 1414   MCV 89.6 09/01/2022 0839   MCV 79 10/08/2021 1414   MCH 28.6 09/01/2022 0839   MCHC 32.0 09/01/2022 0839   RDW 14.2 09/01/2022 0839   RDW 16.5 (H) 10/08/2021 1414   LYMPHSABS 2.3 09/01/2022 0839   MONOABS 0.4 09/01/2022 0839   EOSABS 0.1 09/01/2022 0839   BASOSABS 0.0 09/01/2022 0839    CMP     Component Value Date/Time   NA 137 11/27/2021 1009   NA 142 08/31/2021 0909   K 3.7 11/27/2021 1009   CL 102 11/27/2021 1009   CO2 28 11/27/2021 1009   GLUCOSE 80 11/27/2021 1009   BUN <5 (L) 11/27/2021 1009   BUN 7 08/31/2021 0909   CREATININE 0.60 11/27/2021 1009   CALCIUM 8.4 (L) 11/27/2021 1009   PROT 6.8 11/27/2021 1009   PROT 7.5 08/31/2021 0909   ALBUMIN 3.2 (L) 11/27/2021 1009   ALBUMIN 4.3 08/31/2021 0909   AST 18 11/27/2021 1009   ALT 10 11/27/2021 1009   ALKPHOS 82 11/27/2021 1009   BILITOT 0.4 11/27/2021 1009   BILITOT 0.2 08/31/2021 0909   GFRNONAA >60 11/27/2021 1009   GFRAA  10/16/2009 1101    >60        The eGFR has been calculated using the MDRD equation. This calculation has not been validated in all clinical situations. eGFR's persistently <60 mL/min signify possible Chronic Kidney Disease.    ASSESSMENT & PLAN:   Iron deficiency anemia due to chronic blood loss Labs are reviewed and discussed with patient. Improved hemoglobin and  iron store.  Given that her Gallagher is stable, I recommend no IV Venofer treatments.  Patient is discharged from my clinic. I recommend patient to continue follow up with primary care physician. Patient may re-establish care in the future if clinically indicated.  The patient was provided an opportunity to ask questions and all were answered. The patient agreed with the plan and demonstrated an understanding of the instructions.   I provided 15 minutes of face-to-face video visit time during this encounter, and > 50% was spent counseling as documented under my assessment & plan.   Earlie Server, MD 09/03/2022 4:30 PM

## 2022-09-06 ENCOUNTER — Inpatient Hospital Stay: Payer: BC Managed Care – PPO

## 2022-10-06 ENCOUNTER — Encounter: Payer: Self-pay | Admitting: Oncology
